# Patient Record
Sex: Female | Born: 1965 | Race: White | Hispanic: No | Marital: Married | Smoking: Former smoker
Health system: Southern US, Community
[De-identification: ages and names within clinical notes are randomized; demographics above are authoritative.]

## PROBLEM LIST (undated history)

## (undated) DIAGNOSIS — F988 Other specified behavioral and emotional disorders with onset usually occurring in childhood and adolescence: Secondary | ICD-10-CM

## (undated) DIAGNOSIS — I10 Essential (primary) hypertension: Secondary | ICD-10-CM

## (undated) DIAGNOSIS — R51 Headache: Secondary | ICD-10-CM

## (undated) DIAGNOSIS — Z8619 Personal history of other infectious and parasitic diseases: Secondary | ICD-10-CM

## (undated) DIAGNOSIS — K829 Disease of gallbladder, unspecified: Secondary | ICD-10-CM

## (undated) DIAGNOSIS — R519 Headache, unspecified: Secondary | ICD-10-CM

## (undated) HISTORY — DX: Headache: R51

## (undated) HISTORY — PX: CHOLECYSTECTOMY: SHX55

## (undated) HISTORY — DX: Essential (primary) hypertension: I10

## (undated) HISTORY — DX: Personal history of other infectious and parasitic diseases: Z86.19

## (undated) HISTORY — PX: ENDOMETRIAL ABLATION: SHX621

## (undated) HISTORY — DX: Disease of gallbladder, unspecified: K82.9

## (undated) HISTORY — DX: Other specified behavioral and emotional disorders with onset usually occurring in childhood and adolescence: F98.8

## (undated) HISTORY — DX: Headache, unspecified: R51.9

---

## 1999-08-09 HISTORY — PX: OTHER SURGICAL HISTORY: SHX169

## 1999-09-21 ENCOUNTER — Encounter: Admission: RE | Admit: 1999-09-21 | Discharge: 1999-09-21 | Payer: Self-pay | Admitting: *Deleted

## 1999-09-21 ENCOUNTER — Encounter: Payer: Self-pay | Admitting: *Deleted

## 1999-10-08 ENCOUNTER — Observation Stay (HOSPITAL_COMMUNITY): Admission: RE | Admit: 1999-10-08 | Discharge: 1999-10-09 | Payer: Self-pay | Admitting: General Surgery

## 2004-08-27 ENCOUNTER — Encounter: Admission: RE | Admit: 2004-08-27 | Discharge: 2004-08-27 | Payer: Self-pay | Admitting: Family Medicine
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
## 2006-02-10 ENCOUNTER — Ambulatory Visit: Payer: Self-pay | Admitting: Family Medicine

## 2007-06-28 ENCOUNTER — Telehealth (INDEPENDENT_AMBULATORY_CARE_PROVIDER_SITE_OTHER): Payer: Self-pay | Admitting: Family Medicine

## 2011-09-12 ENCOUNTER — Ambulatory Visit: Payer: BC Managed Care – PPO | Admitting: Internal Medicine

## 2011-09-12 VITALS — BP 112/70 | HR 76 | Temp 98.3°F | Resp 16 | Ht 65.5 in | Wt 148.2 lb

## 2011-09-12 DIAGNOSIS — F988 Other specified behavioral and emotional disorders with onset usually occurring in childhood and adolescence: Secondary | ICD-10-CM

## 2011-09-12 MED ORDER — AMPHETAMINE-DEXTROAMPHETAMINE 10 MG PO TABS: 10.0000 mg | ORAL_TABLET | Freq: Two times a day (BID) | ORAL | Status: DC

## 2011-09-12 NOTE — Progress Notes (Signed)
46 year old female referred to me by friend because of problems in her life with organization and productivity. She has been this way since early childhood. She excepted poor academic performance rather than ever being treated for attention deficit disorder in middle school and high school. 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 This is provided a very satisfactory life. She has a 4 year old son who is in the first year at [employer] and he was suffering academic failure for the first time in his life during the first semester. He had been a Consulting civil engineer in high school without much effort. He went for an evaluation for  attention deficit disorder and was found to be positive, started on medication, and in had all A's in his neck semester in college. The daughter is a Holiday representative in high school who has a similar profile procrastination but makes good grades because is so easy.   She has no health problems and is on no medication. She plays tennis and competitive leagues and notes that her attention drifts in the middle of sets. She has trouble balancing her checkbook. she procrastinates andcan't do regular duties at home including cooking meals and paying items on time. She can finishtask easily. She cannot read without reread sentences. She loses things constantly. There is no hyperactivity.  Review of systems is otherwise negative.  Physical exam  HEENT normal without thyromegaly or nodules  Heart has a regular rhythm without murmurs rubs gallops or clicks.  Neuro is intact.  Problem #1 adult attention deficit disorder. She is functioning very well and the question about whether to use medicine is a quality of life issue. We discussed all the side effects and potential problems with medication. She would prefer a trial of medication to see if it helps with the problem she finds with organization and efficiency.  Plan Adderall 10 mg for a 4-6 hour window. She  may very the dose to estimate the effectiveness. She will return in 3 Lotter for reevaluation.

## 2011-09-16 NOTE — Progress Notes (Unsigned)
Filled out Prior Auth form for pt's adderall and faxed to insurance with confirmation.

## 2011-10-05 ENCOUNTER — Encounter: Payer: Self-pay | Admitting: Internal Medicine

## 2011-10-05 ENCOUNTER — Ambulatory Visit (INDEPENDENT_AMBULATORY_CARE_PROVIDER_SITE_OTHER): Payer: BC Managed Care – PPO | Admitting: Internal Medicine

## 2011-10-05 VITALS — BP 114/71 | HR 67 | Temp 97.6°F | Resp 16 | Ht 65.25 in | Wt 147.6 lb

## 2011-10-05 DIAGNOSIS — F988 Other specified behavioral and emotional disorders with onset usually occurring in childhood and adolescence: Secondary | ICD-10-CM

## 2011-10-05 MED ORDER — AMPHETAMINE-DEXTROAMPHETAMINE 10 MG PO TABS: 10.0000 mg | ORAL_TABLET | Freq: Two times a day (BID) | ORAL | Status: DC

## 2011-10-05 MED ORDER — AMPHETAMINE-DEXTROAMPHETAMINE 10 MG PO TABS: 10.0000 mg | ORAL_TABLET | Freq: Every day | ORAL | Status: DC

## 2011-10-05 NOTE — Progress Notes (Signed)
  Subjective:    Patient ID: Rachael Scott, female    DOB: 1966-02-09, 46 y.o.   MRN: [id number]  HPIFollowup after initial diagnosis of ADD and first attempted treatment with Adderall 10 mg She has done extremely well. This greatly improves her distractibility, her task completion, the organization, her ability to focus in sports and outside events,And her procrastination. Her husband doesn't know she is on medication and has been wondering about her much work she gets done now. There are no bad side effects  Son ADHD [employer] club swimming predental junior/daughter 17 to converse Review of Systems     Objective:   Physical ExamVital signs stable and neurological intact        Assessment & Plan:  ADD-Adult  Adderall 10 Twice a day #60x3 months  Followup in 3 months for recheck

## 2013-05-21 ENCOUNTER — Ambulatory Visit (INDEPENDENT_AMBULATORY_CARE_PROVIDER_SITE_OTHER): Payer: BC Managed Care – PPO | Admitting: Podiatry

## 2013-05-21 ENCOUNTER — Ambulatory Visit (INDEPENDENT_AMBULATORY_CARE_PROVIDER_SITE_OTHER): Payer: BC Managed Care – PPO

## 2013-05-21 ENCOUNTER — Encounter: Payer: Self-pay | Admitting: Podiatry

## 2013-05-21 VITALS — BP 111/66 | HR 63 | Resp 12 | Ht 65.0 in | Wt 140.0 lb

## 2013-05-21 DIAGNOSIS — M722 Plantar fascial fibromatosis: Secondary | ICD-10-CM

## 2013-05-21 MED ORDER — METHYLPREDNISOLONE (PAK) 4 MG PO TABS: ORAL_TABLET | ORAL | Status: DC

## 2013-05-21 NOTE — Progress Notes (Signed)
N-sharp, burning L-plantar heel left D-1year O-gradual C-AM pain, active in tennis, worse A-walking/activity T-Tylenol, gel inserts, ice  **Has a tennis tournament coming up soon**

## 2013-05-21 NOTE — Progress Notes (Signed)
Marki presents today with a cc of plantar faciitis of the left heel for over a year.  States that the pain is sharp and burning.  Has tried tylenol and gel inserts.  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  Mornings are worse and activity causes more pain.  O: VSS, A&OX3.  I have reviewed her PMH, allergies and medications.  Pulses are strong and palpable b/l.Neuro is intact b/l. DTR and muscle strength is normal and symmetrical.Orthopedic evaluation demonstrates all joints distal to the ankle have a full range of motion without crepitation.  Positive pain on palpation of the left heel at the plantar fascial calcaneal insertion site.  Radiographs demonstrate thickening of the plantar fascia at the calcaneal insertion site left.  Cutaneous evaluation demonstrates normal dermatologic findings.  A:  Plantar fasciitis left foot time one year plus.  P:  Injection of the left heel, plantar fascial strapping, night splint left. Medrol 6 day pack. Follow up with her in one month.

## 2013-05-21 NOTE — Patient Instructions (Addendum)
Plantar Fasciitis Plantar fasciitis is a common condition that causes foot pain. It is soreness (inflammation) of the band of tough fibrous tissue on the bottom of the foot that runs from the heel bone (calcaneus) to the ball of the foot. The cause of this soreness may be from excessive standing, poor fitting shoes, running on hard surfaces, being overweight, having an abnormal walk, or overuse (this is common in runners) of the painful foot or feet. It is also common in aerobic exercise dancers and ballet dancers. SYMPTOMS  Most people with plantar fasciitis complain of:  Severe pain in the morning on the bottom of their foot especially when taking the first steps out of bed. This pain recedes after a few minutes of walking.  Severe pain is experienced also during walking following a long period of inactivity.  Pain is worse when walking barefoot or up stairs DIAGNOSIS   Your caregiver will diagnose this condition by examining and feeling your foot.  Special tests such as X-rays of your foot, are usually not needed. PREVENTION   Consult a sports medicine professional before beginning a new exercise program.  Walking programs offer a good workout. With walking there is a lower chance of overuse injuries common to runners. There is less impact and less jarring of the joints.  Begin all new exercise programs slowly. If problems or pain develop, decrease the amount of time or distance until you are at a comfortable level.  Wear good shoes and replace them regularly.  Stretch your foot and the heel cords at the back of the ankle (Achilles tendon) both before and after exercise.  Run or exercise on even surfaces that are not hard. For example, asphalt is better than pavement.  Do not run barefoot on hard surfaces.  If using a treadmill, vary the incline.  Do not continue to workout if you have foot or joint problems. Seek professional help if they do not improve. HOME CARE INSTRUCTIONS     Avoid activities that cause you pain until you recover.  Use ice or cold packs on the problem or painful areas after working out.  Only take over-the-counter or prescription medicines for pain, discomfort, or fever as directed by your caregiver.  Soft shoe inserts or athletic shoes with air or gel sole cushions may be helpful.  If problems continue or become more severe, consult a sports medicine caregiver or your own health care provider. Cortisone is a potent anti-inflammatory medication that may be injected into the painful area. You can discuss this treatment with your caregiver. MAKE SURE YOU:   Understand these instructions.  Will watch your condition.  Will get help right away if you are not doing well or get worse. Document Released: 04/19/2001 Document Revised: 10/17/2011 Document Reviewed: 06/18/2008 Exit[employer] Patient Information 2014 [employer]. Plantar Fasciitis (Heel Spur Syndrome) with Rehab The plantar fascia is a fibrous, ligament-like, soft-tissue structure that spans the bottom of the foot. Plantar fasciitis is a condition that causes pain in the foot due to inflammation of the tissue. SYMPTOMS  Pain and tenderness on the underneath side of the foot. Pain that worsens with standing or walking. CAUSES  Plantar fasciitis is caused by irritation and injury to the plantar fascia on the underneath side of the foot. Common mechanisms of injury include: Direct trauma to bottom of the foot. Damage to a small nerve that runs under the foot where the main fascia attaches to the heel bone. Stress placed on the plantar fascia due   to bone spurs. RISK INCREASES WITH:  Activities that place stress on the plantar fascia (running, jumping, pivoting, or cutting). Poor strength and flexibility. Improperly fitted shoes. Tight calf muscles. Flat feet. Failure to warm-up properly before activity. Obesity. PREVENTION Warm up and stretch properly before activity. Allow for  adequate recovery between workouts. Maintain physical fitness: Strength, flexibility, and endurance. Cardiovascular fitness. Maintain a health body weight. Avoid stress on the plantar fascia. Wear properly fitted shoes, including arch supports for individuals who have flat feet. PROGNOSIS  If treated properly, then the symptoms of plantar fasciitis usually resolve without surgery. However, occasionally surgery is necessary. RELATED COMPLICATIONS  Recurrent symptoms that may result in a chronic condition. Problems of the lower back that are caused by compensating for the injury, such as limping. Pain or weakness of the foot during push-off following surgery. Chronic inflammation, scarring, and partial or complete fascia tear, occurring more often from repeated injections. TREATMENT  Treatment initially involves the use of ice and medication to help reduce pain and inflammation. The use of strengthening and stretching exercises may help reduce pain with activity, especially stretches of the Achilles tendon. These exercises may be performed at home or with a therapist. Your caregiver may recommend that you use heel cups of arch supports to help reduce stress on the plantar fascia. Occasionally, corticosteroid injections are given to reduce inflammation. If symptoms persist for greater than 6 months despite non-surgical (conservative), then surgery may be recommended.  MEDICATION  If pain medication is necessary, then nonsteroidal anti-inflammatory medications, such as aspirin and ibuprofen, or other minor pain relievers, such as acetaminophen, are often recommended. Do not take pain medication within 7 days before surgery. Prescription pain relievers may be given if deemed necessary by your caregiver. Use only as directed and only as much as you need. Corticosteroid injections may be given by your caregiver. These injections should be reserved for the most serious cases, because they may only be  given a certain number of times. HEAT AND COLD Cold treatment (icing) relieves pain and reduces inflammation. Cold treatment should be applied for 10 to 15 minutes every 2 to 3 hours for inflammation and pain and immediately after any activity that aggravates your symptoms. Use ice packs or massage the area with a piece of ice (ice massage). Heat treatment may be used prior to performing the stretching and strengthening activities prescribed by your caregiver, physical therapist, or athletic trainer. Use a heat pack or soak the injury in warm water. SEEK IMMEDIATE MEDICAL CARE IF: Treatment seems to offer no benefit, or the condition worsens. Any medications produce adverse side effects. EXERCISES RANGE OF MOTION (ROM) AND STRETCHING EXERCISES - Plantar Fasciitis (Heel Spur Syndrome) These exercises may help you when beginning to rehabilitate your injury. Your symptoms may resolve with or without further involvement from your physician, physical therapist or athletic trainer. While completing these exercises, remember:  Restoring tissue flexibility helps normal motion to return to the joints. This allows healthier, less painful movement and activity. An effective stretch should be held for at least 30 seconds. A stretch should never be painful. You should only feel a gentle lengthening or release in the stretched tissue. RANGE OF MOTION - Toe Extension, Flexion Sit with your right / left leg crossed over your opposite knee. Grasp your toes and gently pull them back toward the top of your foot. You should feel a stretch on the bottom of your toes and/or foot. Hold this stretch for __________ seconds. Now, gently   pull your toes toward the bottom of your foot. You should feel a stretch on the top of your toes and or foot. Hold this stretch for __________ seconds. Repeat __________ times. Complete this stretch __________ times per day.  RANGE OF MOTION - Ankle Dorsiflexion, Active Assisted Remove  shoes and sit on a chair that is preferably not on a carpeted surface. Place right / left foot under knee. Extend your opposite leg for support. Keeping your heel down, slide your right / left foot back toward the chair until you feel a stretch at your ankle or calf. If you do not feel a stretch, slide your bottom forward to the edge of the chair, while still keeping your heel down. Hold this stretch for __________ seconds. Repeat __________ times. Complete this stretch __________ times per day.  STRETCH  Gastroc, Standing Place hands on wall. Extend right / left leg, keeping the front knee somewhat bent. Slightly point your toes inward on your back foot. Keeping your right / left heel on the floor and your knee straight, shift your weight toward the wall, not allowing your back to arch. You should feel a gentle stretch in the right / left calf. Hold this position for __________ seconds. Repeat __________ times. Complete this stretch __________ times per day. STRETCH  Soleus, Standing Place hands on wall. Extend right / left leg, keeping the other knee somewhat bent. Slightly point your toes inward on your back foot. Keep your right / left heel on the floor, bend your back knee, and slightly shift your weight over the back leg so that you feel a gentle stretch deep in your back calf. Hold this position for __________ seconds. Repeat __________ times. Complete this stretch __________ times per day. STRETCH  Gastrocsoleus, Standing  Note: This exercise can place a lot of stress on your foot and ankle. Please complete this exercise only if specifically instructed by your caregiver.  Place the ball of your right / left foot on a step, keeping your other foot firmly on the same step. Hold on to the wall or a rail for balance. Slowly lift your other foot, allowing your body weight to press your heel down over the edge of the step. You should feel a stretch in your right / left calf. Hold this  position for __________ seconds. Repeat this exercise with a slight bend in your right / left knee. Repeat __________ times. Complete this stretch __________ times per day.  STRENGTHENING EXERCISES - Plantar Fasciitis (Heel Spur Syndrome)  These exercises may help you when beginning to rehabilitate your injury. They may resolve your symptoms with or without further involvement from your physician, physical therapist or athletic trainer. While completing these exercises, remember:  Muscles can gain both the endurance and the strength needed for everyday activities through controlled exercises. Complete these exercises as instructed by your physician, physical therapist or athletic trainer. Progress the resistance and repetitions only as guided. STRENGTH - Towel Curls Sit in a chair positioned on a non-carpeted surface. Place your foot on a towel, keeping your heel on the floor. Pull the towel toward your heel by only curling your toes. Keep your heel on the floor. If instructed by your physician, physical therapist or athletic trainer, add ____________________ at the end of the towel. Repeat __________ times. Complete this exercise __________ times per day. STRENGTH - Ankle Inversion Secure one end of a rubber exercise band/tubing to a fixed object (table, pole). Loop the other end around your foot   just before your toes. Place your fists between your knees. This will focus your strengthening at your ankle. Slowly, pull your big toe up and in, making sure the band/tubing is positioned to resist the entire motion. Hold this position for __________ seconds. Have your muscles resist the band/tubing as it slowly pulls your foot back to the starting position. Repeat __________ times. Complete this exercises __________ times per day.  Document Released: 07/25/2005 Document Revised: 10/17/2011 Document Reviewed: 11/06/2008 Exit[employer] Patient Information 2014 [employer].  

## 2013-06-25 ENCOUNTER — Ambulatory Visit: Payer: BC Managed Care – PPO | Admitting: Podiatry

## 2014-04-30 DIAGNOSIS — G43109 Migraine with aura, not intractable, without status migrainosus: Secondary | ICD-10-CM | POA: Insufficient documentation

## 2015-04-24 ENCOUNTER — Other Ambulatory Visit: Payer: Self-pay | Admitting: *Deleted

## 2015-04-24 DIAGNOSIS — I8392 Asymptomatic varicose veins of left lower extremity: Secondary | ICD-10-CM

## 2015-05-07 ENCOUNTER — Encounter: Payer: Self-pay | Admitting: Vascular Surgery

## 2015-05-11 ENCOUNTER — Encounter: Payer: Self-pay | Admitting: Vascular Surgery

## 2015-05-11 ENCOUNTER — Inpatient Hospital Stay (HOSPITAL_COMMUNITY): Admission: RE | Admit: 2015-05-11 | Payer: Self-pay | Source: Ambulatory Visit

## 2015-08-14 ENCOUNTER — Emergency Department (HOSPITAL_COMMUNITY): Payer: BLUE CROSS/BLUE SHIELD

## 2015-08-14 ENCOUNTER — Encounter (HOSPITAL_COMMUNITY): Payer: Self-pay | Admitting: Family Medicine

## 2015-08-14 ENCOUNTER — Emergency Department (HOSPITAL_COMMUNITY)
Admission: EM | Admit: 2015-08-14 | Discharge: 2015-08-14 | Disposition: A | Discharge status: Home / Self Care | Payer: BLUE CROSS/BLUE SHIELD | Attending: Emergency Medicine | Admitting: Emergency Medicine

## 2015-08-14 DIAGNOSIS — R110 Nausea: Secondary | ICD-10-CM | POA: Diagnosis not present

## 2015-08-14 DIAGNOSIS — R079 Chest pain, unspecified: Secondary | ICD-10-CM | POA: Diagnosis present

## 2015-08-14 DIAGNOSIS — R0602 Shortness of breath: Secondary | ICD-10-CM | POA: Diagnosis not present

## 2015-08-14 DIAGNOSIS — Z87891 Personal history of nicotine dependence: Secondary | ICD-10-CM | POA: Insufficient documentation

## 2015-08-14 LAB — CBC
HCT: 40.7 % (ref 36.0–46.0)
Hemoglobin: 13.8 g/dL (ref 12.0–15.0)
MCH: 31 pg (ref 26.0–34.0)
MCHC: 33.9 g/dL (ref 30.0–36.0)
MCV: 91.5 fL (ref 78.0–100.0)
Platelets: 220 10*3/uL (ref 150–400)
RBC: 4.45 MIL/uL (ref 3.87–5.11)
RDW: 12.5 % (ref 11.5–15.5)
WBC: 5.8 10*3/uL (ref 4.0–10.5)

## 2015-08-14 LAB — BASIC METABOLIC PANEL
ANION GAP: 10 (ref 5–15)
BUN: 11 mg/dL (ref 6–20)
CALCIUM: 9.3 mg/dL (ref 8.9–10.3)
CO2: 24 mmol/L (ref 22–32)
Chloride: 107 mmol/L (ref 101–111)
Creatinine, Ser: 0.78 mg/dL (ref 0.44–1.00)
GFR calc Af Amer: >60 mL/min (ref >60)
GLUCOSE: 100 mg/dL — AB (ref 65–99)
Potassium: 3.6 mmol/L (ref 3.5–5.1)
Sodium: 141 mmol/L (ref 135–145)

## 2015-08-14 LAB — I-STAT TROPONIN, ED
TROPONIN I, POC: 0 ng/mL (ref 0.00–0.08)
Troponin i, poc: 0 ng/mL (ref 0.00–0.08)
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
## 2015-08-14 NOTE — Discharge Instructions (Signed)
Try Zantac 150 mg twice a day.  Nonspecific Chest Pain It is often hard to find the cause of chest pain. There is always a chance that your pain could be related to something serious, such as a heart attack or a blood clot in your lungs. Chest pain can also be caused by conditions that are not life-threatening. If you have chest pain, it is very important to follow up with your doctor.  HOME CARE  If you were prescribed an antibiotic medicine, finish it all even if you start to feel better.  Avoid any activities that cause chest pain.  Do not use any tobacco products, including cigarettes, chewing tobacco, or electronic cigarettes. If you need help quitting, ask your doctor.  Do not drink alcohol.  Take medicines only as told by your doctor.  Keep all follow-up visits as told by your doctor. This is important. This includes any further testing if your chest pain does not go away.  Your doctor may tell you to keep your head raised (elevated) while you sleep.  Make lifestyle changes as told by your doctor. These may include:  Getting regular exercise. Ask your doctor to suggest some activities that are safe for you.  Eating a heart-healthy diet. Your doctor or a diet specialist (dietitian) can help you to learn healthy eating options.  Maintaining a healthy weight.  Managing diabetes, if necessary.  Reducing stress. GET HELP IF:  Your chest pain does not go away, even after treatment.  You have a rash with blisters on your chest.  You have a fever. GET HELP RIGHT AWAY IF:  Your chest pain is worse.  You have an increasing cough, or you cough up blood.  You have severe belly (abdominal) pain.  You feel extremely weak.  You pass out (faint).  You have chills.  You have sudden, unexplained chest discomfort.  You have sudden, unexplained discomfort in your arms, back, neck, or jaw.  You have shortness of breath at any time.  You suddenly start to sweat, or your  skin gets clammy.  You feel nauseous.  You vomit.  You suddenly feel light-headed or dizzy.  Your heart begins to beat quickly, or it feels like it is skipping beats. These symptoms may be an emergency. Do not wait to see if the symptoms will go away. Get medical help right away. Call your local emergency services (911 in the [place] Do not drive yourself to the hospital.   This information is not intended to replace advice given to you by your health care provider. Make sure you discuss any questions you have with your health care provider.   Document Released: 01/11/2008 Document Revised: 08/15/2014 Document Reviewed: 02/28/2014 Elsevier Interactive Patient Education [employer].

## 2015-08-14 NOTE — ED Notes (Signed)
EDP at bedside  

## 2015-08-14 NOTE — ED Provider Notes (Signed)
CSN: [id number]     Arrival date & time 08/14/15  1632 History   First MD Initiated Contact with Patient 08/14/15 1907     Chief Complaint  Patient presents with  . Chest Pain  . Hypertension     (Consider location/radiation/quality/duration/timing/severity/associated sxs/prior Treatment) Patient is a 50 y.o. female presenting with chest pain and hypertension.  Chest Pain Pain location:  L lateral chest Pain quality: sharp and shooting   Pain radiates to:  Does not radiate Pain radiates to the back: no   Pain severity:  Moderate Onset quality:  Sudden Duration:  45 minutes Timing:  Rare Progression:  Resolved Chronicity:  Recurrent Relieved by:  Nothing Worsened by:  Nothing tried Ineffective treatments:  None tried Associated symptoms: nausea and shortness of breath   Associated symptoms: no dizziness, no fever, no headache, no palpitations and not vomiting   Hypertension Associated symptoms include chest pain and shortness of breath. Pertinent negatives include no headaches.   Chest pain started today while at physical therapy.  "felt like she pulled a muscle in her chest"  N/diaphoresis.  Worse with deep inspiration.  Lasted about 31min.  Denies smoking, Fam hx of heart disease.  No hx of HTN, HLD, DM.   History reviewed. No pertinent past medical history. Past Surgical History  Procedure Laterality Date  . Cholecystectomy     Family History  Problem Relation Age of Onset  . Hypertension Mother   . Hypertension Father   . Hypothyroidism Sister    Social History  Substance Use Topics  . Smoking status: Former Smoker -- 0.50 packs/day for 15 years    Types: Cigarettes    Quit date: 08/01/2003  . Smokeless tobacco: None  . Alcohol Use: Yes     Comment:  rare - once a month glass of wine   OB History    No data available     Review of Systems  Constitutional: Negative for fever and chills.  HENT: Negative for congestion and rhinorrhea.   Eyes: Negative for  redness and visual disturbance.  Respiratory: Positive for shortness of breath. Negative for wheezing.   Cardiovascular: Positive for chest pain. Negative for palpitations.  Gastrointestinal: Positive for nausea. Negative for vomiting.  Genitourinary: Negative for dysuria and urgency.  Musculoskeletal: Negative for myalgias and arthralgias.  Skin: Negative for pallor and wound.  Neurological: Negative for dizziness and headaches.      Allergies  Review of patient's allergies indicates no known allergies.  Home Medications   Prior to Admission medications   Medication Sig Start Date End Date Taking? Authorizing Provider  acetaminophen (TYLENOL) 325 MG tablet Take 650 mg by mouth every 6 (six) hours as needed for pain.    Historical Provider, MD  methylPREDNIsolone (MEDROL DOSPACK) 4 MG tablet follow package directions 05/21/13   Max T Hyatt, DPM   BP 136/80 mmHg  Pulse 66  Temp(Src) 98.1 F (36.7 C) (Oral)  Resp 17  SpO2 100%  LMP 08/05/2015 Physical Exam  Constitutional: She is oriented to person, place, and time. She appears well-developed and well-nourished. No distress.  HENT:  Head: Normocephalic and atraumatic.  Eyes: EOM are normal. Pupils are equal, round, and reactive to light.  Neck: Normal range of motion. Neck supple.  Cardiovascular: Normal rate and regular rhythm.  Exam reveals no gallop and no friction rub.   No murmur heard. Pulmonary/Chest: Effort normal. She has no wheezes. She has no rales.  Abdominal: Soft. She exhibits no distension. There is no  tenderness.  Musculoskeletal: She exhibits no edema or tenderness.  Neurological: She is alert and oriented to person, place, and time.  Skin: Skin is warm and dry. She is not diaphoretic.  Psychiatric: She has a normal mood and affect. Her behavior is normal.  Nursing note and vitals reviewed.   ED Course  Procedures (including critical care time) Labs Review Labs Reviewed  BASIC METABOLIC PANEL -  Abnormal; Notable for the following:    Glucose, Bld 100 (*)    All other components within normal limits  CBC  I-STAT TROPOININ, ED  Randolm Idol, ED    Imaging Review Dg Chest 2 View  08/14/2015  CLINICAL DATA:  Chest pain, headache and shortness of breath today. Initial encounter. EXAM: CHEST  2 VIEW COMPARISON:  None. FINDINGS: The lungs are clear. Heart size is normal. There is no pneumothorax or pleural effusion. No focal bony abnormality. The patient is status post cholecystectomy appear IMPRESSION: Negative chest. Electronically Signed   By: Inge Rise M.D.   On: 08/14/2015 17:41   I have personally reviewed and evaluated these images and lab results as part of my medical decision-making.  ED ECG REPORT   Date: 08/17/2015  Rate: 72  Rhythm: normal sinus rhythm  QRS Axis: normal  Intervals: normal  ST/T Wave abnormalities: normal  Conduction Disutrbances:none  Narrative Interpretation:   Old EKG Reviewed: unchanged  I have personally reviewed the EKG tracing and agree with the computerized printout as noted.   MDM   Final diagnoses:  Chest pain, unspecified chest pain type    50 yo F with Chest pain.  Lasted for about 45 min, resolved with rest. EKG unremarkable chest x-ray normal. Delta troponin negative. PERC negative. Patient is pain-free in the ED. We'll have her follow-up with her family physician.   I have discussed the diagnosis/risks/treatment options with the patient and family and believe the pt to be eligible for discharge home to follow-up with PCP. We also discussed returning to the ED immediately if new or worsening sx occur. We discussed the sx which are most concerning (e.g., sudden worsening pain, fever, inability to tolerate by mouth) that necessitate immediate return. Medications administered to the patient during their visit and any new prescriptions provided to the patient are listed below.  Medications given during this visit Medications -  No data to display  Discharge Medication List as of 08/14/2015  9:20 PM      The patient appears reasonably screen and/or stabilized for discharge and I doubt any other medical condition or other [facility] requiring further screening, evaluation, or treatment in the ED at this time prior to discharge.      Deno Etienne, DO 08/17/15 1404

## 2017-09-05 ENCOUNTER — Other Ambulatory Visit (HOSPITAL_COMMUNITY)
Admission: RE | Admit: 2017-09-05 | Discharge: 2017-09-05 | Disposition: A | Discharge status: Home / Self Care | Payer: BLUE CROSS/BLUE SHIELD | Source: Ambulatory Visit | Attending: Nurse Practitioner | Admitting: Nurse Practitioner

## 2017-09-05 ENCOUNTER — Ambulatory Visit: Payer: BLUE CROSS/BLUE SHIELD | Admitting: Nurse Practitioner

## 2017-09-05 ENCOUNTER — Encounter: Payer: Self-pay | Admitting: Nurse Practitioner

## 2017-09-05 VITALS — BP 138/78 | HR 74 | Temp 97.4°F | Ht 65.0 in | Wt 152.0 lb

## 2017-09-05 DIAGNOSIS — Z124 Encounter for screening for malignant neoplasm of cervix: Secondary | ICD-10-CM

## 2017-09-05 DIAGNOSIS — F988 Other specified behavioral and emotional disorders with onset usually occurring in childhood and adolescence: Secondary | ICD-10-CM | POA: Insufficient documentation

## 2017-09-05 DIAGNOSIS — Z0000 Encounter for general adult medical examination without abnormal findings: Secondary | ICD-10-CM | POA: Insufficient documentation

## 2017-09-05 DIAGNOSIS — G43909 Migraine, unspecified, not intractable, without status migrainosus: Secondary | ICD-10-CM | POA: Insufficient documentation

## 2017-09-05 DIAGNOSIS — Z1231 Encounter for screening mammogram for malignant neoplasm of breast: Secondary | ICD-10-CM

## 2017-09-05 DIAGNOSIS — Z87891 Personal history of nicotine dependence: Secondary | ICD-10-CM | POA: Insufficient documentation

## 2017-09-05 DIAGNOSIS — Z1211 Encounter for screening for malignant neoplasm of colon: Secondary | ICD-10-CM | POA: Diagnosis not present

## 2017-09-05 DIAGNOSIS — Z136 Encounter for screening for cardiovascular disorders: Secondary | ICD-10-CM

## 2017-09-05 DIAGNOSIS — Z13220 Encounter for screening for lipoid disorders: Secondary | ICD-10-CM | POA: Diagnosis not present

## 2017-09-05 NOTE — Patient Instructions (Addendum)
Please sign medical release to get records from Dr. Sonia Baller and/or Dr. Raliegh Scarlet, [place].  Return to lab fasting at least 6-8hrs prior to blood draw.   you will be contacted to schedule mammogram.  Contact your insurance about cologuard cost.  Health Maintenance, Female Adopting a healthy lifestyle and getting preventive care can go a long way to promote health and wellness. Talk with your health care provider about what schedule of regular examinations is right for you. This is a good chance for you to check in with your provider about disease prevention and staying healthy. In between checkups, there are plenty of things you can do on your own. Experts have done a lot of research about which lifestyle changes and preventive measures are most likely to keep you healthy. Ask your health care provider for more information. Weight and diet Eat a healthy diet  Be sure to include plenty of vegetables, fruits, low-fat dairy products, and lean protein.  Do not eat a lot of foods high in solid fats, added sugars, or salt.  Get regular exercise. This is one of the most important things you can do for your health. ? Most adults should exercise for at least 150 minutes each week. The exercise should increase your heart rate and make you sweat (moderate-intensity exercise). ? Most adults should also do strengthening exercises at least twice a week. This is in addition to the moderate-intensity exercise.  Maintain a healthy weight  Body mass index (BMI) is a measurement that can be used to identify possible weight problems. It estimates body fat based on height and weight. Your health care provider can help determine your BMI and help you achieve or maintain a healthy weight.  For females 37 years of age and older: ? A BMI below 18.5 is considered underweight. ? A BMI of 18.5 to 24.9 is normal. ? A BMI of 25 to 29.9 is considered overweight. ? A BMI of 30 and above is considered obese.  Watch  levels of cholesterol and blood lipids  You should start having your blood tested for lipids and cholesterol at 52 years of age, then have this test every 5 years.  You may need to have your cholesterol levels checked more often if: ? Your lipid or cholesterol levels are high. ? You are older than 52 years of age. ? You are at high risk for heart disease.  Cancer screening Lung Cancer  Lung cancer screening is recommended for adults 5-73 years old who are at high risk for lung cancer because of a history of smoking.  A yearly low-dose CT scan of the lungs is recommended for people who: ? Currently smoke. ? Have quit within the past 15 years. ? Have at least a 30-pack-year history of smoking. A pack year is smoking an average of one pack of cigarettes a day for 1 year.  Yearly screening should continue until it has been 15 years since you quit.  Yearly screening should stop if you develop a health problem that would prevent you from having lung cancer treatment.  Breast Cancer  Practice breast self-awareness. This means understanding how your breasts normally appear and feel.  It also means doing regular breast self-exams. Let your health care provider know about any changes, no matter how small.  If you are in your 20s or 30s, you should have a clinical breast exam (CBE) by a health care provider every 1-3 years as part of a regular health exam.  If you are  73 or older, have a CBE every year. Also consider having a breast X-ray (mammogram) every year.  If you have a family history of breast cancer, talk to your health care provider about genetic screening.  If you are at high risk for breast cancer, talk to your health care provider about having an MRI and a mammogram every year.  Breast cancer gene (BRCA) assessment is recommended for women who have family members with BRCA-related cancers. BRCA-related cancers include: ? Breast. ? Ovarian. ? Tubal. ? Peritoneal  cancers.  Results of the assessment will determine the need for genetic counseling and BRCA1 and BRCA2 testing.  Cervical Cancer Your health care provider may recommend that you be screened regularly for cancer of the pelvic organs (ovaries, uterus, and vagina). This screening involves a pelvic examination, including checking for microscopic changes to the surface of your cervix (Pap test). You may be encouraged to have this screening done every 3 years, beginning at age 14.  For women ages 34-65, health care providers may recommend pelvic exams and Pap testing every 3 years, or they may recommend the Pap and pelvic exam, combined with testing for human papilloma virus (HPV), every 5 years. Some types of HPV increase your risk of cervical cancer. Testing for HPV may also be done on women of any age with unclear Pap test results.  Other health care providers may not recommend any screening for nonpregnant women who are considered low risk for pelvic cancer and who do not have symptoms. Ask your health care provider if a screening pelvic exam is right for you.  If you have had past treatment for cervical cancer or a condition that could lead to cancer, you need Pap tests and screening for cancer for at least 20 years after your treatment. If Pap tests have been discontinued, your risk factors (such as having a new sexual partner) need to be reassessed to determine if screening should resume. Some women have medical problems that increase the chance of getting cervical cancer. In these cases, your health care provider may recommend more frequent screening and Pap tests.  Colorectal Cancer  This type of cancer can be detected and often prevented.  Routine colorectal cancer screening usually begins at 52 years of age and continues through 52 years of age.  Your health care provider may recommend screening at an earlier age if you have risk factors for colon cancer.  Your health care provider may also  recommend using home test kits to check for hidden blood in the stool.  A small camera at the end of a tube can be used to examine your colon directly (sigmoidoscopy or colonoscopy). This is done to check for the earliest forms of colorectal cancer.  Routine screening usually begins at age 8.  Direct examination of the colon should be repeated every 5-10 years through 52 years of age. However, you may need to be screened more often if early forms of precancerous polyps or small growths are found.  Skin Cancer  Check your skin from head to toe regularly.  Tell your health care provider about any new moles or changes in moles, especially if there is a change in a mole's shape or color.  Also tell your health care provider if you have a mole that is larger than the size of a pencil eraser.  Always use sunscreen. Apply sunscreen liberally and repeatedly throughout the day.  Protect yourself by wearing long sleeves, pants, a wide-brimmed hat, and sunglasses whenever you  are outside.  Heart disease, diabetes, and high blood pressure  High blood pressure causes heart disease and increases the risk of stroke. High blood pressure is more likely to develop in: ? People who have blood pressure in the high end of the normal range (130-139/85-89 mm Hg). ? People who are overweight or obese. ? People who are African American.  If you are 19-20 years of age, have your blood pressure checked every 3-5 years. If you are 49 years of age or older, have your blood pressure checked every year. You should have your blood pressure measured twice-once when you are at a hospital or clinic, and once when you are not at a hospital or clinic. Record the average of the two measurements. To check your blood pressure when you are not at a hospital or clinic, you can use: ? An automated blood pressure machine at a pharmacy. ? A home blood pressure monitor.  If you are between 73 years and 61 years old, ask your  health care provider if you should take aspirin to prevent strokes.  Have regular diabetes screenings. This involves taking a blood sample to check your fasting blood sugar level. ? If you are at a normal weight and have a low risk for diabetes, have this test once every three years after 52 years of age. ? If you are overweight and have a high risk for diabetes, consider being tested at a younger age or more often. Preventing infection Hepatitis B  If you have a higher risk for hepatitis B, you should be screened for this virus. You are considered at high risk for hepatitis B if: ? You were born in a country where hepatitis B is common. Ask your health care provider which countries are considered high risk. ? Your parents were born in a high-risk country, and you have not been immunized against hepatitis B (hepatitis B vaccine). ? You have HIV or AIDS. ? You use needles to inject street drugs. ? You live with someone who has hepatitis B. ? You have had sex with someone who has hepatitis B. ? You get hemodialysis treatment. ? You take certain medicines for conditions, including cancer, organ transplantation, and autoimmune conditions.  Hepatitis C  Blood testing is recommended for: ? Everyone born from [birth year] through 1965. ? Anyone with known risk factors for hepatitis C.  Sexually transmitted infections (STIs)  You should be screened for sexually transmitted infections (STIs) including gonorrhea and chlamydia if: ? You are sexually active and are younger than 52 years of age. ? You are older than 52 years of age and your health care provider tells you that you are at risk for this type of infection. ? Your sexual activity has changed since you were last screened and you are at an increased risk for chlamydia or gonorrhea. Ask your health care provider if you are at risk.  If you do not have HIV, but are at risk, it may be recommended that you take a prescription medicine daily to  prevent HIV infection. This is called pre-exposure prophylaxis (PrEP). You are considered at risk if: ? You are sexually active and do not regularly use condoms or know the HIV status of your partner(s). ? You take drugs by injection. ? You are sexually active with a partner who has HIV.  Talk with your health care provider about whether you are at high risk of being infected with HIV. If you choose to begin PrEP, you should first be  tested for HIV. You should then be tested every 3 months for as long as you are taking PrEP. Pregnancy  If you are premenopausal and you may become pregnant, ask your health care provider about preconception counseling.  If you may become pregnant, take 400 to 800 micrograms (mcg) of folic acid every day.  If you want to prevent pregnancy, talk to your health care provider about birth control (contraception). Osteoporosis and menopause  Osteoporosis is a disease in which the bones lose minerals and strength with aging. This can result in serious bone fractures. Your risk for osteoporosis can be identified using a bone density scan.  If you are 32 years of age or older, or if you are at risk for osteoporosis and fractures, ask your health care provider if you should be screened.  Ask your health care provider whether you should take a calcium or vitamin D supplement to lower your risk for osteoporosis.  Menopause may have certain physical symptoms and risks.  Hormone replacement therapy may reduce some of these symptoms and risks. Talk to your health care provider about whether hormone replacement therapy is right for you. Follow these instructions at home:  Schedule regular health, dental, and eye exams.  Stay current with your immunizations.  Do not use any tobacco products including cigarettes, chewing tobacco, or electronic cigarettes.  If you are pregnant, do not drink alcohol.  If you are breastfeeding, limit how much and how often you drink  alcohol.  Limit alcohol intake to no more than 1 drink per day for nonpregnant women. One drink equals 12 ounces of beer, 5 ounces of wine, or 1 ounces of hard liquor.  Do not use street drugs.  Do not share needles.  Ask your health care provider for help if you need support or information about quitting drugs.  Tell your health care provider if you often feel depressed.  Tell your health care provider if you have ever been abused or do not feel safe at home. This information is not intended to replace advice given to you by your health care provider. Make sure you discuss any questions you have with your health care provider. Document Released: 02/07/2011 Document Revised: 12/31/2015 Document Reviewed: 04/28/2015 Elsevier Interactive Patient Education  [employer].

## 2017-09-05 NOTE — Progress Notes (Signed)
Subjective:    Patient ID: Rachael Scott, female    DOB: [birth year], 52 y.o.   MRN: [id number]  Patient presents today for complete physical  HPI   no previous pcp in last 62yrs.  Migraine: Chronic, waxing and waning. Previous managed by Dr. Bobby Rumpf with headache clinic(now retired) Increased frequency in last 95yrs Trigger related to menstrual cycle. Location: temporal lobe. Symptoms: nausea, vertigo, no aura, light sentivisity. Had CT head done several year ago (normal per patient). Managed with promethazine, maxalt, and flurbiprofen.  Diagnosed with ADD at age 59. Diagnosed by Dr. Sonia Baller (private practice, now retired) Adderall recently prescribed by Dr. Mellody Dance with [facility] onsite clinic. Last seen 28yrs ago. Use of Adderall prn.  Immunizations: (TDAP, Hep C screen, Pneumovax, Influenza, zoster)  Health Maintenance  Topic Date Due  . Tetanus Vaccine  03/16/1985  . Pap Smear  03/17/1987  . Mammogram  03/16/2016  . Colon Cancer Screening  03/16/2016  . HIV Screening  09/05/2018*  . Flu Shot  Completed  *Topic was postponed. The date shown is not the original due date.   Diet:heart healthy.  Weight:  Wt Readings from Last 3 Encounters:  09/05/17 152 lb (68.9 kg)  05/21/13 140 lb (63.5 kg)  10/05/11 147 lb 9.6 oz (67 kg)   Exercise:tennis 3-4times a week.  Fall Risk: Fall Risk  09/05/2017  Falls in the past year? No   Home Safety:hoemw with husband.  Depression/Suicide: Depression screen [facility] 2/9 09/05/2017  Decreased Interest 0  Down, Depressed, Hopeless 0  PHQ - 2 Score 0   No flowsheet data found. Vision:up to date.  Dental:up to date.  Medications and allergies reviewed with patient and updated if appropriate.  Patient Active Problem List   Diagnosis Date Noted  . Hx of migraine headaches 09/06/2017  . Plantar fascial fibromatosis 05/21/2013  . 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 acetaminophen (TYLENOL) 325 MG tablet Take 650 mg by mouth every 6 (six) hours as needed for pain.    Marland Kitchen amphetamine-dextroamphetamine (ADDERALL) 10 MG tablet Take by mouth.    . flurbiprofen (ANSAID) 100 MG tablet TK 1 T PO BID PRF HEADACHE. NO MORE THAN 4 DOSES PER WEEK  5  . promethazine (PHENERGAN) 25 MG tablet Take 25 mg by mouth every 6 (six) hours as needed for nausea or vomiting.    . rizatriptan (MAXALT) 10 MG tablet Take 10 mg by mouth as needed for migraine. May repeat in 2 hours if needed     No current facility-administered medications on file prior to visit.     Past Medical History:  Diagnosis Date  . ADD (attention deficit disorder)   . Gallbladder disease   . Headache    3 a mo  . History of chickenpox     Past Surgical History:  Procedure Laterality Date  . CESAREAN SECTION  1992  . CHOLECYSTECTOMY    . ENDOMETRIAL ABLATION    . tummy  2001   tummy tuck    Social History   Socioeconomic History  . Marital status: Single    Spouse name: None  . Number of children: None  . Years of education: None  . Highest education level: None  Social Needs  . Financial resource strain: None  . Food insecurity - worry: None  . Food insecurity - inability: None  . Transportation needs -  medical: None  . Transportation needs - non-medical: None  Occupational History  . None  Tobacco Use  . Smoking status: Former Smoker    Packs/day: 0.50    Years: 15.00    Pack years: 7.50    Types: Cigarettes    Last attempt to quit: 08/01/2003    Years since quitting: 14.1  . Smokeless tobacco: Never Used  Substance and Sexual Activity  . Alcohol use: Yes    Comment:  rare - once a month glass of wine  . Drug use: No  . Sexual activity: None  Other Topics Concern  . None  Social History Narrative  . None    Family History  Problem Relation Age of Onset  . Hypothyroidism Sister   .  Hypertension Mother   . Hypertension Father        Review of Systems  Constitutional: Negative for fever, malaise/fatigue and weight loss.  HENT: Negative for congestion and sore throat.   Eyes:       Negative for visual changes  Respiratory: Negative for cough and shortness of breath.   Cardiovascular: Negative for chest pain, palpitations and leg swelling.  Gastrointestinal: Negative for blood in stool, constipation, diarrhea and heartburn.  Genitourinary: Negative for dysuria, frequency and urgency.  Musculoskeletal: Negative for falls, joint pain and myalgias.  Skin: Negative for rash.  Neurological: Positive for headaches. Negative for dizziness and sensory change.  Endo/Heme/Allergies: Does not bruise/bleed easily.  Psychiatric/Behavioral: Negative for depression, substance abuse and suicidal ideas. The patient is not nervous/anxious and does not have insomnia.     Objective:   Vitals:   09/05/17 1011  BP: 138/78  Pulse: 74  Temp: (!) 97.4 F (36.3 C)  SpO2: 97%    Body mass index is 25.29 kg/m.   Physical Examination:  Physical Exam  Constitutional: She is oriented to person, place, and time and well-developed, well-nourished, and in no distress. No distress.  HENT:  Right Ear: External ear normal.  Left Ear: External ear normal.  Nose: Nose normal.  Mouth/Throat: No oropharyngeal exudate.  Eyes: Conjunctivae and EOM are normal. Pupils are equal, round, and reactive to light. No scleral icterus.  Neck: Normal range of motion. Neck supple. No thyromegaly present.  Cardiovascular: Normal rate, regular rhythm, normal heart sounds and intact distal pulses.  Pulmonary/Chest: Effort normal and breath sounds normal. Right breast exhibits no mass, no nipple discharge and no skin change. Left breast exhibits no mass, no nipple discharge and no skin change. Breasts are symmetrical.  Abdominal: Soft. Bowel sounds are normal. She exhibits no distension. There is no  tenderness.  Genitourinary: Rectum normal, vagina normal, cervix normal, right adnexa normal, left adnexa normal and vulva normal. Rectal exam shows no external hemorrhoid. Cervix exhibits no motion tenderness. No vaginal discharge found.  Musculoskeletal: Normal range of motion. She exhibits no edema or tenderness.  Lymphadenopathy:    She has no cervical adenopathy.  Neurological: She is alert and oriented to person, place, and time. Gait normal.  Skin: Skin is warm and dry.  Psychiatric: Affect and judgment normal.  Vitals reviewed.   ASSESSMENT and PLAN:  Sameeha was seen today for establish care.  Diagnoses and all orders for this visit:  Encounter for preventative adult health care examination -     TSH; Future -     Comprehensive metabolic panel; Future -     CBC; Future -     Lipid panel; Future -     Cytology - PAP -  MS DIGITAL SCREENING BILATERAL; Future  Encounter for lipid screening for cardiovascular disease -     Lipid panel; Future  Breast cancer screening by mammogram -     MS DIGITAL SCREENING BILATERAL; Future  Screen for colon cancer  Encounter for Papanicolaou smear for cervical cancer screening -     Cytology - PAP   No problem-specific Assessment & Plan notes found for this encounter.     Follow up: Return if symptoms worsen or fail to improve.  Wilfred Lacy, NP

## 2017-09-06 ENCOUNTER — Encounter: Payer: Self-pay | Admitting: Nurse Practitioner

## 2017-09-06 ENCOUNTER — Telehealth: Payer: Self-pay | Admitting: Nurse Practitioner

## 2017-09-06 DIAGNOSIS — Z8669 Personal history of other diseases of the nervous system and sense organs: Secondary | ICD-10-CM | POA: Insufficient documentation

## 2017-09-06 NOTE — Telephone Encounter (Signed)
Cologuard order for the pt,  Waiting for the result.   Order [id number]

## 2017-09-07 ENCOUNTER — Other Ambulatory Visit (INDEPENDENT_AMBULATORY_CARE_PROVIDER_SITE_OTHER): Payer: BLUE CROSS/BLUE SHIELD

## 2017-09-07 DIAGNOSIS — Z0000 Encounter for general adult medical examination without abnormal findings: Secondary | ICD-10-CM

## 2017-09-07 DIAGNOSIS — Z13220 Encounter for screening for lipoid disorders: Secondary | ICD-10-CM

## 2017-09-07 DIAGNOSIS — Z136 Encounter for screening for cardiovascular disorders: Secondary | ICD-10-CM

## 2017-09-07 LAB — COMPREHENSIVE METABOLIC PANEL
ALBUMIN: 4.2 g/dL (ref 3.5–5.2)
ALT: 8 U/L (ref 0–35)
AST: 15 U/L (ref 0–37)
Alkaline Phosphatase: 43 U/L (ref 39–117)
BUN: 17 mg/dL (ref 6–23)
CALCIUM: 9.1 mg/dL (ref 8.4–10.5)
CHLORIDE: 104 meq/L (ref 96–112)
CO2: 26 meq/L (ref 19–32)
CREATININE: 0.79 mg/dL (ref 0.40–1.20)
GFR: 81.39 mL/min (ref >60.00)
Glucose, Bld: 92 mg/dL (ref 70–99)
Potassium: 4.5 mEq/L (ref 3.5–5.1)
Sodium: 136 mEq/L (ref 135–145)
Total Bilirubin: 0.8 mg/dL (ref 0.2–1.2)
Total Protein: 7 g/dL (ref 6.0–8.3)

## 2017-09-07 LAB — CBC
HCT: 41.6 % (ref 36.0–46.0)
Hemoglobin: 14.2 g/dL (ref 12.0–15.0)
MCHC: 34.1 g/dL (ref 30.0–36.0)
MCV: 91.1 fl (ref 78.0–100.0)
PLATELETS: 201 10*3/uL (ref 150.0–400.0)
RBC: 4.57 Mil/uL (ref 3.87–5.11)
RDW: 12.9 % (ref 11.5–15.5)
WBC: 4.6 10*3/uL (ref 4.0–10.5)

## 2017-09-07 LAB — LIPID PANEL
CHOL/HDL RATIO: 3
Cholesterol: 194 mg/dL (ref 0–200)
HDL: 73.7 mg/dL (ref >39.00)
LDL Cholesterol: 109 mg/dL — ABNORMAL HIGH (ref 0–99)
NonHDL: 120.34
Triglycerides: 59 mg/dL (ref 0.0–149.0)
VLDL: 11.8 mg/dL (ref 0.0–40.0)

## 2017-09-07 LAB — TSH: TSH: 1.77 u[IU]/mL (ref 0.35–4.50)

## 2017-09-07 LAB — CYTOLOGY - PAP
DIAGNOSIS: NEGATIVE
HPV (WINDOPATH): NOT DETECTED

## 2017-09-18 DIAGNOSIS — Z1212 Encounter for screening for malignant neoplasm of rectum: Secondary | ICD-10-CM | POA: Diagnosis not present

## 2017-09-18 DIAGNOSIS — Z1211 Encounter for screening for malignant neoplasm of colon: Secondary | ICD-10-CM | POA: Diagnosis not present

## 2017-09-23 LAB — COLOGUARD: Cologuard: NEGATIVE

## 2017-09-26 ENCOUNTER — Encounter: Payer: Self-pay | Admitting: Nurse Practitioner

## 2017-09-26 NOTE — Progress Notes (Signed)
Abstracted result and sent to scan  

## 2017-09-27 NOTE — Telephone Encounter (Signed)
Pt is aware of result: negative.

## 2017-10-24 ENCOUNTER — Encounter: Payer: Self-pay | Admitting: Nurse Practitioner

## 2017-10-24 DIAGNOSIS — Z8669 Personal history of other diseases of the nervous system and sense organs: Secondary | ICD-10-CM

## 2017-10-25 MED ORDER — FLURBIPROFEN 100 MG PO TABS: 100.0000 mg | ORAL_TABLET | Freq: Two times a day (BID) | ORAL | 0 refills | Status: DC | PRN

## 2017-10-25 MED ORDER — RIZATRIPTAN BENZOATE 10 MG PO TABS: 10.0000 mg | ORAL_TABLET | ORAL | 0 refills | Status: DC | PRN

## 2017-10-25 NOTE — Telephone Encounter (Signed)
okey to refill, please advise.

## 2017-11-28 DIAGNOSIS — G43839 Menstrual migraine, intractable, without status migrainosus: Secondary | ICD-10-CM | POA: Diagnosis not present

## 2017-11-28 DIAGNOSIS — G43019 Migraine without aura, intractable, without status migrainosus: Secondary | ICD-10-CM | POA: Diagnosis not present

## 2017-11-28 DIAGNOSIS — Z049 Encounter for examination and observation for unspecified reason: Secondary | ICD-10-CM | POA: Diagnosis not present

## 2018-01-22 DIAGNOSIS — G43839 Menstrual migraine, intractable, without status migrainosus: Secondary | ICD-10-CM | POA: Diagnosis not present

## 2018-01-22 DIAGNOSIS — G43019 Migraine without aura, intractable, without status migrainosus: Secondary | ICD-10-CM | POA: Diagnosis not present

## 2018-04-24 DIAGNOSIS — G43019 Migraine without aura, intractable, without status migrainosus: Secondary | ICD-10-CM | POA: Diagnosis not present

## 2018-04-24 DIAGNOSIS — G43839 Menstrual migraine, intractable, without status migrainosus: Secondary | ICD-10-CM | POA: Diagnosis not present

## 2018-06-13 ENCOUNTER — Encounter: Payer: Self-pay | Admitting: Nurse Practitioner

## 2018-06-13 ENCOUNTER — Ambulatory Visit: Payer: BLUE CROSS/BLUE SHIELD | Admitting: Nurse Practitioner

## 2018-06-13 VITALS — BP 138/84 | HR 71 | Temp 98.4°F | Ht 65.0 in | Wt 144.8 lb

## 2018-06-13 DIAGNOSIS — Z23 Encounter for immunization: Secondary | ICD-10-CM

## 2018-06-13 DIAGNOSIS — H61892 Other specified disorders of left external ear: Secondary | ICD-10-CM

## 2018-06-13 NOTE — Patient Instructions (Addendum)
Apply triple antibiotics ointment x 2days then stop.  Your ear drum is intact.   Earache, Adult An earache, or ear pain, can be caused by many things, including:  An infection.  Ear wax buildup.  Ear pressure.  Something in the ear that should not be there (foreign body).  A sore throat.  Tooth problems.  Jaw problems.  Treatment of the earache will depend on the cause. If the cause is not clear or cannot be determined, you may need to watch your symptoms until your earache goes away or until a cause is found. Follow these instructions at home: Pay attention to any changes in your symptoms. Take these actions to help with your pain:  Take or apply over-the-counter and prescription medicines only as told by your health care provider.  If you were prescribed an antibiotic medicine, use it as told by your health care provider. Do not stop using the antibiotic even if you start to feel better.  Do not put anything in your ear other than medicine that is prescribed by your health care provider.  If directed, apply heat to the affected area as often as told by your health care provider. Use the heat source that your health care provider recommends, such as a moist heat pack or a heating pad. ? Place a towel between your skin and the heat source. ? Leave the heat on for 20-30 minutes. ? Remove the heat if your skin turns bright red. This is especially important if you are unable to feel pain, heat, or cold. You may have a greater risk of getting burned.  If directed, put ice on the ear: ? Put ice in a plastic bag. ? Place a towel between your skin and the bag. ? Leave the ice on for 20 minutes, 2-3 times a day.  Try resting in an upright position instead of lying down. This may help to reduce pressure in your ear and relieve pain.  Chew gum if it helps to relieve your ear pain.  Treat any allergies as told by your health care provider.  Keep all follow-up visits as told by  your health care provider. This is important.  Contact a health care provider if:  Your pain does not improve within 2 days.  Your earache gets worse.  You have new symptoms.  You have a fever. Get help right away if:  You have a severe headache.  You have a stiff neck.  You have trouble swallowing.  You have redness or swelling behind your ear.  You have fluid or blood coming from your ear.  You have hearing loss.  You feel dizzy. This information is not intended to replace advice given to you by your health care provider. Make sure you discuss any questions you have with your health care provider. Document Released: 03/11/2004 Document Revised: 03/22/2016 Document Reviewed: 01/18/2016 Elsevier Interactive Patient Education  [employer].

## 2018-06-13 NOTE — Progress Notes (Signed)
Subjective:  Patient ID: Rachael Scott, female    DOB: [birth year]  Age: 52 y.o. MRN: [id number]  CC: Ear Pain (patient is complaining of left ear pain all the time, little bleeding, started yesterday. )   Otalgia   There is pain in the left ear. This is a new problem. The current episode started yesterday. The problem occurs constantly. The problem has been unchanged. There has been no fever. Pertinent negatives include no coughing, ear discharge, hearing loss, neck pain, rash, sore throat or vomiting. She has tried nothing for the symptoms.  she noticed blood after scratching left ear.  Reviewed past Medical, Social and Family history today.  Outpatient Medications Prior to Visit  Medication Sig Dispense Refill  . acetaminophen (TYLENOL) 325 MG tablet Take 650 mg by mouth every 6 (six) hours as needed for pain.    Rachael Scott Kitchen amphetamine-dextroamphetamine (ADDERALL) 10 MG tablet Take by mouth.    Georjean Mode XR CS24 sprinkle cap TK 3 CS PO QD  3  . flurbiprofen (ANSAID) 100 MG tablet Take 1 tablet (100 mg total) by mouth 2 (two) times daily between meals as needed. (Patient not taking: Reported on 06/13/2018) 30 tablet 0  . promethazine (PHENERGAN) 25 MG tablet Take 25 mg by mouth every 6 (six) hours as needed for nausea or vomiting.    . rizatriptan (MAXALT) 10 MG tablet Take 1 tablet (10 mg total) by mouth as needed for migraine. May repeat in 2 hours if needed (Patient not taking: Reported on 06/13/2018) 10 tablet 0   No facility-administered medications prior to visit.     ROS See HPI  Objective:  BP 138/84   Pulse 71   Temp 98.4 F (36.9 C) (Oral)   Ht 5\' 5"  (1.651 m)   Wt 144 lb 12.8 oz (65.7 kg)   SpO2 99%   BMI 24.10 kg/m   BP Readings from Last 3 Encounters:  06/13/18 138/84  09/05/17 138/78  08/14/15 136/80    Wt Readings from Last 3 Encounters:  06/13/18 144 lb 12.8 oz (65.7 kg)  09/05/17 152 lb (68.9 kg)  05/21/13 140 lb (63.5 kg)    Physical Exam  HENT:  Right  Ear: Tympanic membrane, external ear and ear canal normal. No middle ear effusion.  Left Ear: Tympanic membrane and external ear normal.  No middle ear effusion.  Left ear canal with small abrasion on outer part of canal. No active bleeding.  Neck: Normal range of motion. Neck supple.  Cardiovascular: Normal rate.  Pulmonary/Chest: Effort normal.  Lymphadenopathy:    She has no cervical adenopathy.  Vitals reviewed.   Lab Results  Component Value Date   WBC 4.6 09/07/2017   HGB 14.2 09/07/2017   HCT 41.6 09/07/2017   PLT 201.0 09/07/2017   GLUCOSE 92 09/07/2017   CHOL 194 09/07/2017   TRIG 59.0 09/07/2017   HDL 73.70 09/07/2017   LDLCALC 109 (H) 09/07/2017   ALT 8 09/07/2017   AST 15 09/07/2017   NA 136 09/07/2017   K 4.5 09/07/2017   CL 104 09/07/2017   CREATININE 0.79 09/07/2017   BUN 17 09/07/2017   CO2 26 09/07/2017   TSH 1.77 09/07/2017    Assessment & Plan:   Rachael Scott was seen today for ear pain.  Diagnoses and all orders for this visit:  Irritation of external ear canal, left  Need for influenza vaccination -     Flu Vaccine QUAD 36+ mos IM   I am having Rachael Scott  A. Stemler maintain her acetaminophen, amphetamine-dextroamphetamine, promethazine, rizatriptan, flurbiprofen, and QUDEXY XR.  No orders of the defined types were placed in this encounter.   Follow-up: Return if symptoms worsen or fail to improve.  Wilfred Lacy, NP

## 2018-06-15 ENCOUNTER — Encounter: Payer: Self-pay | Admitting: Nurse Practitioner

## 2018-08-28 DIAGNOSIS — G43839 Menstrual migraine, intractable, without status migrainosus: Secondary | ICD-10-CM | POA: Diagnosis not present

## 2018-08-28 DIAGNOSIS — G43019 Migraine without aura, intractable, without status migrainosus: Secondary | ICD-10-CM | POA: Diagnosis not present

## 2019-01-30 DIAGNOSIS — Z03818 Encounter for observation for suspected exposure to other biological agents ruled out: Secondary | ICD-10-CM | POA: Diagnosis not present

## 2019-02-18 DIAGNOSIS — G43839 Menstrual migraine, intractable, without status migrainosus: Secondary | ICD-10-CM | POA: Diagnosis not present

## 2019-02-18 DIAGNOSIS — G43019 Migraine without aura, intractable, without status migrainosus: Secondary | ICD-10-CM | POA: Diagnosis not present

## 2019-02-22 DIAGNOSIS — I8311 Varicose veins of right lower extremity with inflammation: Secondary | ICD-10-CM | POA: Diagnosis not present

## 2019-02-22 DIAGNOSIS — I8312 Varicose veins of left lower extremity with inflammation: Secondary | ICD-10-CM | POA: Diagnosis not present

## 2019-03-07 DIAGNOSIS — I8312 Varicose veins of left lower extremity with inflammation: Secondary | ICD-10-CM | POA: Diagnosis not present

## 2019-05-17 DIAGNOSIS — I8312 Varicose veins of left lower extremity with inflammation: Secondary | ICD-10-CM | POA: Diagnosis not present

## 2019-06-07 ENCOUNTER — Other Ambulatory Visit: Payer: Self-pay

## 2019-06-07 DIAGNOSIS — Z20822 Contact with and (suspected) exposure to covid-19: Secondary | ICD-10-CM

## 2019-06-09 LAB — NOVEL CORONAVIRUS, NAA: SARS-CoV-2, NAA: NOT DETECTED

## 2019-08-13 DIAGNOSIS — G43839 Menstrual migraine, intractable, without status migrainosus: Secondary | ICD-10-CM | POA: Diagnosis not present

## 2019-08-13 DIAGNOSIS — G43019 Migraine without aura, intractable, without status migrainosus: Secondary | ICD-10-CM | POA: Diagnosis not present

## 2019-09-23 ENCOUNTER — Ambulatory Visit (INDEPENDENT_AMBULATORY_CARE_PROVIDER_SITE_OTHER)
Admission: RE | Admit: 2019-09-23 | Discharge: 2019-09-23 | Disposition: A | Discharge status: Home / Self Care | Payer: BLUE CROSS/BLUE SHIELD | Source: Ambulatory Visit

## 2019-09-23 DIAGNOSIS — R519 Headache, unspecified: Secondary | ICD-10-CM | POA: Diagnosis not present

## 2019-09-23 MED ORDER — PROMETHAZINE HCL 25 MG PO TABS: 25.0000 mg | ORAL_TABLET | Freq: Four times a day (QID) | ORAL | 0 refills | Status: AC | PRN

## 2019-09-23 NOTE — Discharge Instructions (Signed)
Keep taking the Maxalt as prescribed Phenergan for nausea Flonase and zyrtec OTC for allergies.  Follow up as needed for continued or worsening symptoms

## 2019-09-23 NOTE — ED Provider Notes (Signed)
Virtual Visit via Video Note:  Rachael Scott  initiated request for Telemedicine visit with [facility] team. I connected with Rachael Scott  on 09/24/2019 at 10:06 AM  for a synchronized telemedicine visit using a video enabled HIPPA compliant telemedicine application. I verified that I am speaking with Rachael Scott  using two identifiers. Orvan July, NP  was physically located in a [facility]gent care site and Rachael Scott was located at a different location.   The limitations of evaluation and management by telemedicine as well as the availability of in-person appointments were discussed. Patient was informed that she  may incur a bill ( including co-pay) for this virtual visit encounter. Rachael Scott  expressed understanding and gave verbal consent to proceed with virtual visit.     History of Present Illness:Rachael Scott  is a 54 y.o. female presents with headache.  Headache has been constant, behind the right eye and right frontal area  for approximate 3 days.  Symptoms been constant, waxing waning.  Describes as throbbing.  She has had some associated nausea.  She is prescribed Maxalt for migraines and she did take this but got extremely nauseous and is concerned about taking this medication again.  Mild photophobia but no phonophobia.  Denies any vision changes.  Denies any injury to the eye or eye pain.  He also has some mild sinusitis type symptoms.  Past Medical History:  Diagnosis Date  . ADD (attention deficit disorder)   . Gallbladder disease   . Headache    3 a mo  . History of chickenpox     No Known Allergies      Observations/Objective:VITALS: Per patient if applicable, see vitals. GENERAL: Alert, appears well and in no acute distress. HEENT: Atraumatic, conjunctiva clear, no obvious abnormalities on inspection of external nose and ears. NECK: Normal movements of the head and neck. CARDIOPULMONARY: No increased WOB. Speaking in clear sentences.  I:E ratio WNL.  MS: Moves all visible extremities without noticeable abnormality. PSYCH: Pleasant and cooperative, well-groomed. Speech normal rate and rhythm. Affect is appropriate. Insight and judgement are appropriate. Attention is focused, linear, and appropriate.  NEURO: CN grossly intact. Oriented as arrived to appointment on time with no prompting. Moves both UE equally.  SKIN: No obvious lesions, wounds, erythema, or cyanosis noted on face or hands.     Assessment and Plan: Migraine-we will have her continue using the Maxalt.  Refilled Phenergan for nausea. Recommended Flonase and Zyrtec over-the-counter in case this is sinus related.    Follow Up Instructions: Return for evaluation in person if symptoms continue or worsen.    I discussed the assessment and treatment plan with the patient. The patient was provided an opportunity to ask questions and all were answered. The patient agreed with the plan and demonstrated an understanding of the instructions.   The patient was advised to call back or seek an in-person evaluation if the symptoms worsen or if the condition fails to improve as anticipated.   Orvan July, NP  09/24/2019 10:06 AM         Orvan July, NP 09/24/19 1006

## 2020-03-02 DIAGNOSIS — G43839 Menstrual migraine, intractable, without status migrainosus: Secondary | ICD-10-CM | POA: Diagnosis not present

## 2020-03-02 DIAGNOSIS — G43019 Migraine without aura, intractable, without status migrainosus: Secondary | ICD-10-CM | POA: Diagnosis not present

## 2020-03-04 ENCOUNTER — Other Ambulatory Visit: Payer: Self-pay

## 2020-03-04 ENCOUNTER — Ambulatory Visit
Admission: RE | Admit: 2020-03-04 | Discharge: 2020-03-04 | Disposition: A | Discharge status: Home / Self Care | Payer: BC Managed Care – PPO | Source: Ambulatory Visit | Attending: Physician Assistant | Admitting: Physician Assistant

## 2020-03-04 VITALS — BP 188/109 | HR 89 | Temp 98.3°F | Resp 18

## 2020-03-04 DIAGNOSIS — B029 Zoster without complications: Secondary | ICD-10-CM

## 2020-03-04 MED ORDER — VALACYCLOVIR HCL 1 G PO TABS: 1000.0000 mg | ORAL_TABLET | Freq: Three times a day (TID) | ORAL | 0 refills | Status: AC

## 2020-03-04 NOTE — ED Triage Notes (Signed)
Pt c/o lt side of neck pain since last Thursday. When to neurologist on Monday and states a strain in neck. States rash to lt chest/neck/shoulder since last night.

## 2020-03-04 NOTE — ED Provider Notes (Signed)
[facility]    CSN: [id number] Arrival date & time: 03/04/20  1051      History   Chief Complaint Chief Complaint  Patient presents with  . Rash    HPI Rachael Scott is a 54 y.o. female.   54 year old female comes in for left sided rash to the neck/shoulder/chest. States this first started with neck pain 6 days ago and thought this was due to neck strain. Yesterday with vesicular rash to the left side of the neck/shoulder/chest with shooting pain to the scalp. Denies fever, URI symptoms.     Past Medical History:  Diagnosis Date  . ADD (attention deficit disorder)   . Gallbladder disease   . Headache    3 a mo  . History of chickenpox     Patient Active Problem List   Diagnosis Date Noted  . Hx of migraine headaches 09/06/2017  . Plantar fascial fibromatosis 05/21/2013  . 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 CESAREAN SECTION  1992  . CHOLECYSTECTOMY    . ENDOMETRIAL ABLATION    . tummy  2001   tummy tuck    OB History   No obstetric history on file.      Home Medications    Prior to Admission medications   Medication Sig Start Date End Date Taking? Authorizing Provider  acetaminophen (TYLENOL) 325 MG tablet Take 650 mg by mouth every 6 (six) hours as needed for pain.    [provider]  amphetamine-dextroamphetamine (ADDERALL) 10 MG tablet Take by mouth.    [provider]  promethazine (PHENERGAN) 25 MG tablet Take 1 tablet (25 mg total) by mouth every 6 (six) hours as needed for nausea or vomiting. 09/23/19   Loura Halt A, NP  QUDEXY XR CS24 sprinkle cap TK 3 CS PO QD 05/31/18   [provider]  valACYclovir (VALTREX) 1000 MG tablet Take 1 tablet (1,000 mg total) by mouth 3 (three) times daily for 7 days. 03/04/20 03/11/20  Ok Edwards, PA-C    Family History Family History  Problem Relation Age of Onset  . Hypothyroidism  Sister   . Hypertension Mother   . Hypertension Father     Social History Social History   Tobacco Use  . Smoking status: Former Smoker    Packs/day: 0.50    Years: 15.00    Pack years: 7.50    Types: Cigarettes    Quit date: 08/01/2003    Years since quitting: 16.6  . Smokeless tobacco: Never Used  Vaping Use  . Vaping Use: Never used  Substance Use Topics  . Alcohol use: Yes    Comment:  rare - once a month glass of wine  . Drug use: No     Allergies   Patient has no known allergies.   Review of Systems Review of Systems  Reason unable to perform ROS: See HPI as above.     Physical Exam Triage Vital Signs ED Triage Vitals  Enc Vitals Group     BP 03/04/20 1112 (!) 188/109     Pulse Rate 03/04/20 1112 89     Resp 03/04/20 1112 18     Temp 03/04/20 1112 98.3 F (36.8 C)     Temp src --      SpO2 03/04/20 1112 98 %     Weight --      Height --  Head Circumference --      Peak Flow --      Pain Score 03/04/20 1131 8     Pain Loc --      Pain Edu? --      Excl. in [place]? --    No data found.  Updated Vital Signs BP (!) 188/109 (BP Location: Right Arm)   Pulse 89   Temp 98.3 F (36.8 C)   Resp 18   LMP 02/19/2020   SpO2 98%   Physical Exam Constitutional:      General: She is not in acute distress.    Appearance: Normal appearance. She is well-developed. She is not toxic-appearing or diaphoretic.  HENT:     Head: Normocephalic and atraumatic.     Right Ear: Tympanic membrane, ear canal and external ear normal.     Left Ear: Tympanic membrane, ear canal and external ear normal.     Ears:     Comments: No rashes seen to the ear canal Eyes:     Extraocular Movements: Extraocular movements intact.     Conjunctiva/sclera: Conjunctivae normal.     Pupils: Pupils are equal, round, and reactive to light.  Pulmonary:     Effort: Pulmonary effort is normal. No respiratory distress.  Musculoskeletal:     Cervical back: Normal range of motion and  neck supple.  Skin:    General: Skin is warm and dry.     Comments: Vesicular rash in likely C5 distribution without erythema, warmth. Rash not extending to the face, ear  Neurological:     Mental Status: She is alert and oriented to person, place, and time.      UC Treatments / Results  Labs (all labs ordered are listed, but only abnormal results are displayed) Labs Reviewed - No data to display  EKG   Radiology No results found.  Procedures Procedures (including critical care time)  Medications Ordered in UC Medications - No data to display  Initial Impression / Assessment and Plan / UC Course  I have reviewed the triage vital signs and the nursing notes.  Pertinent labs & imaging results that were available during my care of the patient were reviewed by me and considered in my medical decision making (see chart for details).    Valacyclovir as directed. Symptomatic treatment discussed. Follow up with PCP in 1 week for reevaluation. Return precautions given.  Final Clinical Impressions(s) / UC Diagnoses   Final diagnoses:  Herpes zoster without complication   ED Prescriptions    Medication Sig Dispense Auth. Provider   valACYclovir (VALTREX) 1000 MG tablet Take 1 tablet (1,000 mg total) by mouth 3 (three) times daily for 7 days. 21 tablet Ok Edwards, PA-C     PDMP not reviewed this encounter.   Ok Edwards, PA-C 03/04/20 1240

## 2020-03-04 NOTE — Discharge Instructions (Signed)
Start valacyclovir as directed. Follow up with PCP for recheck in 1 week. Otherwise, go to the ED if worsening symptoms, fever, dizziness, spreading rash

## 2020-03-11 ENCOUNTER — Encounter: Payer: Self-pay | Admitting: Nurse Practitioner

## 2020-03-11 ENCOUNTER — Ambulatory Visit: Payer: BC Managed Care – PPO | Admitting: Nurse Practitioner

## 2020-03-11 ENCOUNTER — Other Ambulatory Visit: Payer: Self-pay

## 2020-03-11 VITALS — BP 138/82 | HR 92 | Temp 97.0°F | Ht 65.0 in | Wt 150.2 lb

## 2020-03-11 DIAGNOSIS — B0229 Other postherpetic nervous system involvement: Secondary | ICD-10-CM | POA: Diagnosis not present

## 2020-03-11 MED ORDER — GABAPENTIN 100 MG PO CAPS: ORAL_CAPSULE | ORAL | 1 refills | Status: DC

## 2020-03-11 NOTE — Progress Notes (Signed)
Subjective:  Patient ID: Rachael Scott, female    DOB: 1966-07-22  Age: 54 y.o. MRN: [id number]  CC: Acute Visit (shingles f/up from [facility]-pt said Valtrex worked, pt said she still has some pain an itching)   Rash This is a new problem. The current episode started in the past 7 days. The problem has been gradually improving since onset. The affected locations include the neck. Pertinent negatives include no anorexia, eye pain, facial edema, fatigue, fever, shortness of breath or sore throat. Treatments tried: valtrex. The treatment provided moderate relief. Her past medical history is significant for varicella.   Reviewed past Medical, Social and Family history today.  Outpatient Medications Prior to Visit  Medication Sig Dispense Refill  . acetaminophen (TYLENOL) 325 MG tablet Take 650 mg by mouth every 6 (six) hours as needed for pain.    Marland Kitchen amphetamine-dextroamphetamine (ADDERALL) 10 MG tablet Take by mouth.    Marland Kitchen imipramine (TOFRANIL) 50 MG tablet Take 50 mg by mouth daily.    . promethazine (PHENERGAN) 25 MG tablet Take 1 tablet (25 mg total) by mouth every 6 (six) hours as needed for nausea or vomiting. 30 tablet 0  . valACYclovir (VALTREX) 1000 MG tablet Take 1 tablet (1,000 mg total) by mouth 3 (three) times daily for 7 days. 21 tablet 0  . QUDEXY XR CS24 sprinkle cap TK 3 CS PO QD (Patient not taking: Reported on 03/11/2020)  3   No facility-administered medications prior to visit.    ROS See HPI  Objective:  BP 138/82   Pulse 92   Temp (!) 97 F (36.1 C) (Tympanic)   Ht 5\' 5"  (1.651 m)   Wt 150 lb 3.2 oz (68.1 kg)   LMP 02/19/2020   SpO2 100%   BMI 24.99 kg/m   Physical Exam HENT:     Right Ear: External ear normal.     Left Ear: External ear normal.  Eyes:     General: No scleral icterus.    Extraocular Movements: Extraocular movements intact.     Conjunctiva/sclera: Conjunctivae normal.  Cardiovascular:     Rate and Rhythm: Normal rate.     Pulses: Normal  pulses.  Pulmonary:     Effort: Pulmonary effort is normal.  Musculoskeletal:     Cervical back: Normal range of motion and neck supple.  Lymphadenopathy:     Cervical: No cervical adenopathy.  Skin:    Findings: Rash present. No erythema. Rash is crusting and vesicular.       Neurological:     Mental Status: She is alert and oriented to person, place, and time.  Psychiatric:        Mood and Affect: Mood normal.        Behavior: Behavior normal.        Thought Content: Thought content normal.     Assessment & Plan:  This visit occurred during the SARS-CoV-2 public health emergency.  Safety protocols were in place, including screening questions prior to the visit, additional usage of staff PPE, and extensive cleaning of exam room while observing appropriate contact time as indicated for disinfecting solutions.   Rachael Scott was seen today for acute visit.  Diagnoses and all orders for this visit:  Post herpetic neuralgia -     gabapentin (NEURONTIN) 100 MG capsule; Take 1 capsule (100 mg total) by mouth at bedtime for 3 days, THEN 1 capsule (100 mg total) 2 (two) times daily for 27 days.    Problem List Items Addressed  This Visit    None    Visit Diagnoses    Post herpetic neuralgia    -  Primary   Relevant Medications   gabapentin (NEURONTIN) 100 MG capsule      Follow-up: No follow-ups on file.  Wilfred Lacy, NP

## 2020-03-11 NOTE — Patient Instructions (Signed)
Do not stop gabapentin abruptly. Need to wean off after taking for more than 2weeks. Schedule nurse visit for Shringrix vaccine in 39month   Ramsay Hunt Syndrome  Ramsay Hunt syndrome (RHS) is a viral infection that affects the nerves in the face and the nerves near the inner ear. The infection is caused by the varicella zoster virus (VZV). This is the same virus that causes chickenpox and shingles. After a person has chickenpox, this virus may become inactive. Years later, the virus can become active again and cause Ramsay Hunt syndrome. The trigger may be something that weakens the body's defense system (immune system), like stress. When VZV becomes activated, it moves up the facial nerve and causes a painful rash in or around the ear canal. It may also travel up the nerve that is responsible for hearing. Ramsay Hunt syndrome cannot be passed from person to person (it is not contagious). However, if a person who has never had chickenpox comes in contact with fluid from someone's skin blisters, he or she may develop chickenpox. What are the causes? This condition is caused by the varicella zoster virus. What increases the risk? You may be at risk for Ramsay Hunt syndrome if you have had chickenpox in the past. What are the signs or symptoms? Symptoms of this condition include:  A rash with blisters that breaks out around the ear. The rash may be accompanied by: ? A rash in the inner ear, along the side of the face, or up the scalp. ? A rash inside the mouth. ? Deep and severe pain in the ear. ? Severe, burning pain wherever the rash develops.  Facial nerve weakness. This may cause: ? Drooping on one side of the face. ? Inability to close the eyelid on the affected side of the face. ? Trouble eating. ? Loss of ability to taste on the side of the tongue. If RHS affects the inner ear nerve (auditory nerve), other symptoms may be present. These may include:  Hearing loss.  A spinning  sensation (vertigo).  Clumsiness.  Ringing in the ear (tinnitus). How is this diagnosed? This condition may be diagnosed based on:  Your symptoms.  A physical exam.  Other tests to confirm the diagnosis. These may include: ? Viral culture. This test is done by swabbing the rash or blister to check for VZV. ? Blood tests to check for antibodies to VZV. Antibodies are proteins that your body produces in response to germs. ? Nerve conduction studies (electroneurogram). ? MRI scan. ? Hearing tests (audiology). How is this treated? This condition may be treated with:  An antiviral medicine to treat the virus.  NSAIDs or prescription pain relievers to control pain.  An anti-inflammatory medicine (steroid) called prednisone.  Antibiotic medicine, if the rash becomes infected. If treatment starts within the first 3 days of having symptoms, it will shorten the course of the pain and rash that is caused by RHS. Treatment will also prevent your facial nerve from continuing to weaken. Without treatment, it is possible that you may not recover full use of your facial nerve. Follow these instructions at home: Medicines  Take over-the-counter and prescription medicines only as told by your health care provider.  If you were prescribed an antibiotic or antiviral medicine, take or apply it as told by your health care provider. Do not stop using the antibiotic or antiviral medicine even if your condition improves.  Do not drive or use heavy machinery while taking prescription pain medicine.  If  you are taking prescription pain medicine, take actions to prevent or treat constipation. Your health care provider may recommend that you: ? Drink enough fluid to keep your urine pale yellow. ? Eat foods that are high in fiber, such as fresh fruits and vegetables, whole grains, and beans. ? Limit foods that are high in fat and processed sugars, such as fried or sweet foods. ? Take an over-the-counter  or prescription medicine for constipation. General instructions  If told by your health care provider, use artificial tears and wear an eye patch to protect your eye until you can close your eyelid again.  Do not scratch or pick at the rash.  Put a cold, wet cloth (cold compress) on the itchy area as told by your health care provider.  If you have blisters in your mouth, do not eat or drink spicy, salty, or acidic things. Soft, bland, and cold foods and beverages are easiest to swallow.  Keep all follow-up visits as told by your health care provider. This is important. Contact a health care provider if:  Your pain medicine is not helping.  You have chills or fever.  Your symptoms get worse.  Your symptoms have not gone away after 2 Barocio.  You have any changes in vision. Summary  Ramsay Hunt syndrome (RHS) is a viral infection that affects the nerves in the face and the nerves near the inner ear. The infection is caused by the varicella zoster virus (VZV), which also causes chicken pox and shingles.  After a person has chickenpox, this virus may become inactive. If the inactive VZV virus becomes activated, it moves up the facial nerve and causes a painful rash in or around the ear canal. It can also cause hearing loss and facial nerve weakness, with drooping on one side of the face.  If treatment starts within the first 3 days of having symptoms, it will shorten the course of the pain and rash that are caused by RHS. Treatment will also prevent your facial nerve from continuing to weaken.  Treatments may include antiviral, steroid, and pain medicines. This information is not intended to replace advice given to you by your health care provider. Make sure you discuss any questions you have with your health care provider. Document Revised: 09/20/2018 Document Reviewed: 09/07/2017 Elsevier Patient Education  2020 [employer].

## 2020-03-17 ENCOUNTER — Encounter: Payer: Self-pay | Admitting: Nurse Practitioner

## 2020-05-14 ENCOUNTER — Other Ambulatory Visit: Payer: Self-pay | Admitting: Nurse Practitioner

## 2020-05-14 DIAGNOSIS — B0229 Other postherpetic nervous system involvement: Secondary | ICD-10-CM

## 2020-06-08 ENCOUNTER — Encounter: Payer: Self-pay | Admitting: Family Medicine

## 2020-06-08 ENCOUNTER — Telehealth (INDEPENDENT_AMBULATORY_CARE_PROVIDER_SITE_OTHER): Payer: BC Managed Care – PPO | Admitting: Family Medicine

## 2020-06-08 VITALS — Temp 100.5°F | Ht 65.0 in | Wt 148.0 lb

## 2020-06-08 DIAGNOSIS — N3000 Acute cystitis without hematuria: Secondary | ICD-10-CM | POA: Insufficient documentation

## 2020-06-08 MED ORDER — NITROFURANTOIN MONOHYD MACRO 100 MG PO CAPS: 100.0000 mg | ORAL_CAPSULE | Freq: Two times a day (BID) | ORAL | 0 refills | Status: AC

## 2020-06-08 NOTE — Progress Notes (Signed)
Established Patient Office Visit  Subjective:  Patient ID: Rachael Scott, female    DOB: 09-17-1965  Age: 54 y.o. MRN: [id number]  CC:  Chief Complaint  Patient presents with  . Fever    fever and dysuria symptoms started yesterday. Highest temp 101.5     HPI Rachael Scott presents for 2 d history of fever, dysuria, and urinary frequency. Without flank pain, n/v, or vaginal discharge. Complains of malaise. Recovering from recent zoster infection.   Past Medical History:  Diagnosis Date  . ADD (attention deficit disorder)   . Gallbladder disease   . Headache    3 a mo  . History of chickenpox     Past Surgical History:  Procedure Laterality Date  . CESAREAN SECTION  1992  . CHOLECYSTECTOMY    . ENDOMETRIAL ABLATION    . tummy  2001   tummy tuck    Family History  Problem Relation Age of Onset  . Hypothyroidism Sister   . Hypertension Mother   . Hypertension Father     Social History   Socioeconomic History  . Marital status: Married    Spouse name: Not on file  . Number of children: Not on file  . Years of education: Not on file  . Highest education level: Not on file  Occupational History  . Not on file  Tobacco Use  . Smoking status: Former Smoker    Packs/day: 0.50    Years: 15.00    Pack years: 7.50    Types: Cigarettes    Quit date: 08/01/2003    Years since quitting: 16.8  . Smokeless tobacco: Never Used  Vaping Use  . Vaping Use: Never used  Substance and Sexual Activity  . Alcohol use: Yes    Comment:  rare - once a month glass of wine  . Drug use: No  . Sexual activity: Not on file  Other Topics Concern  . Not on file  Social History Narrative  . Not on file   Social Determinants of Health   Financial Resource Strain:   . Difficulty of Paying Living Expenses: Not on file  Food Insecurity:   . Worried About Charity fundraiser in the Last Year: Not on file  . Ran Out of Food in the Last Year: Not on file  Transportation Needs:     . Lack of Transportation (Medical): Not on file  . Lack of Transportation (Non-Medical): Not on file  Physical Activity:   . Days of Exercise per Week: Not on file  . Minutes of Exercise per Session: Not on file  Stress:   . Feeling of Stress : Not on file  Social Connections:   . Frequency of Communication with Friends and Family: Not on file  . Frequency of Social Gatherings with Friends and Family: Not on file  . Attends Religious Services: Not on file  . Active Member of Clubs or Organizations: Not on file  . Attends Archivist Meetings: Not on file  . Marital Status: Not on file  Intimate Partner Violence:   . Fear of Current or Ex-Partner: Not on file  . Emotionally Abused: Not on file  . Physically Abused: Not on file  . Sexually Abused: Not on file    Outpatient Medications Prior to Visit  Medication Sig Dispense Refill  . acetaminophen (TYLENOL) 325 MG tablet Take 650 mg by mouth every 6 (six) hours as needed for pain.    Marland Kitchen amphetamine-dextroamphetamine (ADDERALL) 10  MG tablet Take by mouth.    Marland Kitchen imipramine (TOFRANIL) 50 MG tablet Take 50 mg by mouth daily.    . promethazine (PHENERGAN) 25 MG tablet Take 1 tablet (25 mg total) by mouth every 6 (six) hours as needed for nausea or vomiting. 30 tablet 0  . gabapentin (NEURONTIN) 100 MG capsule Take 1 capsule (100 mg total) by mouth 2 (two) times daily. 60 capsule 2  . QUDEXY XR CS24 sprinkle cap TK 3 CS PO QD (Patient not taking: Reported on 03/11/2020)  3   No facility-administered medications prior to visit.    No Known Allergies  ROS Review of Systems  Constitutional: Positive for fatigue and fever. Negative for chills and diaphoresis.  HENT: Negative.  Negative for congestion, postnasal drip and sore throat.   Eyes: Negative for photophobia and visual disturbance.  Respiratory: Negative.  Negative for cough.   Cardiovascular: Negative.   Gastrointestinal: Negative for abdominal pain, nausea and vomiting.   Genitourinary: Positive for dysuria, frequency and urgency. Negative for difficulty urinating, flank pain, genital sores, vaginal bleeding and vaginal discharge.  Musculoskeletal: Negative for back pain.  Allergic/Immunologic: Negative for immunocompromised state.  Neurological: Negative.   Hematological: Negative.   Psychiatric/Behavioral: Negative.       Objective:    Physical Exam Vitals and nursing note reviewed.  Constitutional:      General: She is not in acute distress.    Appearance: Normal appearance. She is not ill-appearing, toxic-appearing or diaphoretic.  HENT:     Head: Normocephalic and atraumatic.     Right Ear: External ear normal.     Left Ear: External ear normal.  Eyes:     General: No scleral icterus.       Right eye: No discharge.        Left eye: No discharge.     Conjunctiva/sclera: Conjunctivae normal.  Pulmonary:     Effort: Pulmonary effort is normal.  Neurological:     Mental Status: She is alert and oriented to person, place, and time.  Psychiatric:        Mood and Affect: Mood normal.        Behavior: Behavior normal.     Temp (!) 100.5 F (38.1 C) (Tympanic)   Ht 5\' 5"  (1.651 m)   Wt 148 lb (67.1 kg)   BMI 24.63 kg/m  Wt Readings from Last 3 Encounters:  06/08/20 148 lb (67.1 kg)  03/11/20 150 lb 3.2 oz (68.1 kg)  06/13/18 144 lb 12.8 oz (65.7 kg)     Health Maintenance Due  Topic Date Due  . Hepatitis C Screening  Never done  . COVID-19 Vaccine (1) Never done  . HIV Screening  Never done  . TETANUS/TDAP  Never done  . MAMMOGRAM  Never done  . COLONOSCOPY  Never done  . INFLUENZA VACCINE  03/08/2020    There are no preventive care reminders to display for this patient.  Lab Results  Component Value Date   TSH 1.77 09/07/2017   Lab Results  Component Value Date   WBC 4.6 09/07/2017   HGB 14.2 09/07/2017   HCT 41.6 09/07/2017   MCV 91.1 09/07/2017   PLT 201.0 09/07/2017   Lab Results  Component Value Date   NA  136 09/07/2017   K 4.5 09/07/2017   CO2 26 09/07/2017   GLUCOSE 92 09/07/2017   BUN 17 09/07/2017   CREATININE 0.79 09/07/2017   BILITOT 0.8 09/07/2017   ALKPHOS 43 09/07/2017   AST  15 09/07/2017   ALT 8 09/07/2017   PROT 7.0 09/07/2017   ALBUMIN 4.2 09/07/2017   CALCIUM 9.1 09/07/2017   ANIONGAP 10 08/14/2015   GFR 81.39 09/07/2017   Lab Results  Component Value Date   CHOL 194 09/07/2017   Lab Results  Component Value Date   HDL 73.70 09/07/2017   Lab Results  Component Value Date   LDLCALC 109 (H) 09/07/2017   Lab Results  Component Value Date   TRIG 59.0 09/07/2017   Lab Results  Component Value Date   CHOLHDL 3 09/07/2017   No results found for: HGBA1C    Assessment & Plan:   Problem List Items Addressed This Visit      Genitourinary   Acute cystitis without hematuria - Primary   Relevant Medications   nitrofurantoin, macrocrystal-monohydrate, (MACROBID) 100 MG capsule   Other Relevant Orders   Urinalysis, Routine w reflex microscopic   Urine Culture      Meds ordered this encounter  Medications  . nitrofurantoin, macrocrystal-monohydrate, (MACROBID) 100 MG capsule    Sig: Take 1 capsule (100 mg total) by mouth 2 (two) times daily for 7 days.    Dispense:  14 capsule    Refill:  0    Follow-up: Return in about 1 week (around 06/15/2020), or if symptoms worsen or fail to improve.   Bring urine for culture tomorrow. Libby Maw, MD   Virtual Visit via Video Note  I connected with Calton Dach Burgueno on 06/08/20 at  3:30 PM EDT by a video enabled telemedicine application and verified that I am speaking with the correct person using two identifiers.  Location: Patient: home alone Provider: home   I discussed the limitations of evaluation and management by telemedicine and the availability of in person appointments. The patient expressed understanding and agreed to proceed.  History of Present Illness:     Observations/Objective:   Assessment and Plan:   Follow Up Instructions:    I discussed the assessment and treatment plan with the patient. The patient was provided an opportunity to ask questions and all were answered. The patient agreed with the plan and demonstrated an understanding of the instructions.   The patient was advised to call back or seek an in-person evaluation if the symptoms worsen or if the condition fails to improve as anticipated.  I provided 20 minutes of non-face-to-face time during this encounter.   Libby Maw, MD Virtual Visit via Video Note  I connected with Calton Dach Krichbaum on 06/08/20 at  3:30 PM EDT by a video enabled telemedicine application and verified that I am speaking with the correct person using two identifiers.  Location: Patient: home alone  Provider: home   I discussed the limitations of evaluation and management by telemedicine and the availability of in person appointments. The patient expressed understanding and agreed to proceed.  History of Present Illness:    Observations/Objective:   Assessment and Plan:   Follow Up Instructions:    I discussed the assessment and treatment plan with the patient. The patient was provided an opportunity to ask questions and all were answered. The patient agreed with the plan and demonstrated an understanding of the instructions.   The patient was advised to call back or seek an in-person evaluation if the symptoms worsen or if the condition fails to improve as anticipated.  I provided 20 minutes of non-face-to-face time during this encounter.   Libby Maw, MD

## 2020-06-09 ENCOUNTER — Other Ambulatory Visit: Payer: Self-pay

## 2020-06-09 ENCOUNTER — Other Ambulatory Visit (INDEPENDENT_AMBULATORY_CARE_PROVIDER_SITE_OTHER): Payer: BC Managed Care – PPO

## 2020-06-09 DIAGNOSIS — N3000 Acute cystitis without hematuria: Secondary | ICD-10-CM | POA: Diagnosis not present

## 2020-06-09 LAB — URINALYSIS, ROUTINE W REFLEX MICROSCOPIC
Ketones, ur: 15 — AB
Nitrite: NEGATIVE
Specific Gravity, Urine: 1.02 (ref 1.000–1.030)
Total Protein, Urine: NEGATIVE
Urine Glucose: NEGATIVE
Urobilinogen, UA: 0.2 (ref 0.0–1.0)
pH: 5.5 (ref 5.0–8.0)

## 2020-09-24 NOTE — ED Notes (Signed)
ED Triage Note       ED Secondary Triage Entered On:  09/24/2020 14:28 EST    Performed On:  09/24/2020 14:27 EST by Julien Girt, RN, Clyde Canterbury               General Information   Barriers to Learning :   None evident   ED Home Meds Section :   Document assessment   [facility] ED Fall Risk Section :   Document assessment   ED Advance Directives Section :   Document assessment   ED Palliative Screen :   N/A (prefilled for <65yo)   Julien Girt, RN, Darcy A - 09/24/2020 14:27 EST   (As Of: 09/24/2020 14:28:45 EST)   Diagnoses(Active)    Closed head injury without LOC  Date:   09/24/2020 ; Diagnosis Type:   Reason For Visit ; Confirmation:   Complaint of ; Clinical Dx:   Closed head injury without LOC ; Classification:   Medical ; Clinical Service:   Emergency medicine ; Code:   PNED ; Probability:   0 ; Diagnosis Code:   [id number]             -    Procedure History   (As Of: 09/24/2020 14:28:45 EST)     [facility]ch Fall Risk Assessment Tool   Hx of falling last 3 months ED Fall :   No   Patient confused or disoriented ED Fall :   No   Patient intoxicated or sedated ED Fall :   No   Patient impaired gait ED Fall :   No   Use a mobility assistance device ED Fall :   No   Patient altered elimination ED Fall :   No   [facility] ED Fall Score :   0    Julien Girt RNJewel Baize A - 09/24/2020 14:27 EST   ED Advance Directive   Advance Directive :   No   Julien Girt, RN, Jewel Baize A - 09/24/2020 14:27 EST

## 2020-09-24 NOTE — ED Notes (Signed)
 ED Patient Summary       ;       [facility] Emergency Department  [address], [place] [address]  [phone]  Discharge Instructions (Patient)  Name: Rhonda Carroll, Rhonda Carroll  DOB:  April 30, 1966                   MRN: [id number]                   FIN: [id number]  Reason For Visit: Closed head injury without LOC; FALL/HIT HEAD  Final Diagnosis: Closed head injury     Visit Date: 09/24/2020 13:11:00  Address: [address] [place] [address]  Phone: [phone]     Emergency Department Providers:         Primary Physician:      GWENN PONCE DEMPSEY PONCE         [facility] would like to thank you for allowing us  to assist you with your healthcare needs. The following includes patient education materials and information regarding your injury/illness.     Follow-up Instructions:  You were seen today on an emergency basis. Please contact your primary care doctor for a follow up appointment. If you received a referral to a specialist doctor, it is important you follow-up as instructed.    It is important that you call your follow-up doctor to schedule and confirm the location of your next appointment. Your doctor may practice at multiple locations. The office location of your follow-up appointment may be different to the one written on your discharge instructions.    If you do not have a primary care doctor, please call [phone] for help in finding a [facility] Provider. For help in finding a specialist doctor, please call [phone].    If your condition gets worse before your follow-up with your primary care doctor or specialist, please return to the Emergency Department.      Coronavirus 2019 (COVID-19) Reminders:     Patients age 61 - 25, with parental consent, and patients over age 4 can make an appointment for a COVID-19 vaccine. Patients can contact their [facility] doctors' offices to schedule an appointment to receive the COVID-19 vaccine. Patients  who do not have a [facility] physician can call [phone] to schedule vaccination appointments.      Follow Up Appointments:  Primary Care Provider:     Name: PCP,  NONE     Phone:                  With: Address: When:   Follow up with primary care provider  Within 1 week   Comments:   Return to ED if symptoms worsen                   Medications that have not changed  Other Medications  imipramine (imipramine 50 mg oral tablet) TAKE 1 TABLET BY MOUTH DAILY.  Last Dose:____________________  rizatriptan (rizatriptan 10 mg oral tablet) TAKE 1 TABLET BY MOUTH AS NEEDED FOR MIGRAINE.  Last Dose:____________________      Allergy Info: No Known Allergies     Discharge Additional Information          Discharge Patient 09/24/20 15:13:00 EST      Patient Education Materials:        Head Injury, Adult      There are many types of head injuries. They can be as minor as  a small bump. Some head injuries can be worse. Worse injuries include:   A strong hit to the head that shakes the brain back and forth, causing damage (concussion).     A bruise (contusion) of the brain. This means there is bleeding in the brain that can cause swelling.     A cracked skull (skull fracture).     Bleeding in the brain that gathers, gets thick (makes a clot), and forms a bump (hematoma).      Most problems from a head injury come in the first 24 hours. However, you may still have side effects up to 7?10 days after your injury. It is important to watch your condition for any changes. You may need to be watched in the emergency department or urgent care, or you may need to stay in the hospital.      What are the causes?    There are many possible causes of a head injury. A serious head injury may be caused by:   A car accident.     Bicycle or motorcycle accidents.     Sports injuries.     Falls.     Being hit by an object.        What are the signs or symptoms?    Symptoms of a head injury include a bruise, bump, or bleeding where the  injury happened. Other physical symptoms may include:   Headache.     Feeling like you may vomit (nauseous) or vomiting.     Dizziness.     Blurred or double vision.     Being uncomfortable around bright lights or loud noises.     Shaking movements that you cannot control (seizures).     Feeling tired.     Trouble being woken up.     Fainting or loss of consciousness.      Mental or emotional symptoms may include:   Feeling grumpy or cranky.     Confusion and memory problems.     Having trouble paying attention or concentrating.     Changes in eating or sleeping habits.     Feeling worried or nervous (anxious).     Feeling sad (depressed).        How is this treated?    Treatment for this condition depends on how severe the injury is and the type of injury you have. The main goal is to prevent problems and to allow the brain time to heal.    Mild head injury    If you have a mild head injury, you may be sent home, and treatment may include:   Being watched. A responsible adult should stay with you for 24 hours after your injury and check on you often.     Physical rest.     Brain rest.     Pain medicines.        Severe head injury    If you have a severe head injury, treatment may include:   Being watched closely. This includes staying in the hospital.     Medicines to:  ? Help with pain.    ? Prevent seizures.    ? Help with brain swelling.       Protecting your airway and using a machine that helps you breathe (ventilator).     Treatments to watch for and manage swelling inside the brain.     Brain surgery. This may be needed to:  ? Remove a collection of blood or  blood clots.    ? Stop the bleeding.    ? Remove a part of the skull. This allows room for the brain to swell.            Follow these instructions at home:    Activity     Rest.     Avoid activities that are hard or tiring.     Make sure you get enough sleep.     Let your brain rest. Do this by limiting activities that need a lot of thought or  attention, such as:  ? Watching TV.    ? Playing memory games and puzzles.    ? Job-related work or homework.    ? Working on [employer], and texting.       Avoid activities that could cause another head injury until your doctor says it is okay. This includes playing sports. Having another head injury, especially before the first one has healed, can be dangerous.     Ask your doctor when it is safe for you to go back to your normal activities, such as work or school. Ask your doctor for a step-by-step plan for slowly going back to your normal activities.     Ask your doctor when you can drive, ride a bicycle, or use heavy machinery. Do not do these activities if you are dizzy.      Lifestyle       Do not drink alcohol until your doctor says it is okay.     Do not use drugs.     If it is harder than usual to remember things, write them down.     If you are easily distracted, try to do one thing at a time.     Talk with family members or close friends when making important decisions.     Tell your friends, family, a trusted co-worker, and work Production designer, theatre/television/film about your injury, symptoms, and limits (restrictions). Have them watch for any problems that are new or getting worse.      General instructions     Take over-the-counter and prescription medicines only as told by your doctor.     Have someone stay with you for 24 hours after your head injury. This person should watch you for any changes in your symptoms and be ready to get help.     Keep all follow-up visits as told by your doctor. This is important.      How is this prevented?     Work on Therapist, music. This can help you avoid falls.     Wear a seat belt when you are in a moving vehicle.     Wear a helmet when you:  ? Ride a bicycle.    ? Ski.    ? Do any other sport or activity that has a risk of injury.       If you drink alcohol:  ? Limit how much you use to:  ? 0?1 drink a day for nonpregnant women.    ? 0?2 drinks a day for men.       ? Be aware of how much alcohol is in your drink. In the [place] one drink equals one 12 oz bottle of beer (355 mL), one 5 oz glass of wine (148 mL), or one 1? oz glass of hard liquor (44 mL).       Make your home safer by:  ? Getting rid of clutter from the floors  and stairs. This includes things that can make you trip.    ? Using grab bars in bathrooms and handrails by stairs.    ? Placing non-slip mats on floors and in bathtubs.    ? Putting more light in dim areas.          Where to find more information     Centers for Disease Control and Prevention: FootballExhibition.com.br      Get help right away if:     You have:  ? A very bad headache that is not helped by medicine.    ? Trouble walking or weakness in your arms and legs.    ? Clear or bloody fluid coming from your nose or ears.    ? Changes in how you see (vision).    ? A seizure.    ? More confusion or more grumpy moods.       Your symptoms get worse.     You are sleepier than normal and have trouble staying awake.     You lose your balance.     The black centers of your eyes (pupils) change in size.     Your speech is slurred.     Your dizziness gets worse.     You vomit.    These symptoms may be an emergency. Do not wait to see if the symptoms will go away. Get medical help right away. Call your local emergency services (911 in the [place] Do not drive yourself to the hospital.      Summary     Head injuries can be as minor as a small bump. Some head injuries can be worse.     Treatment for this condition depends on how severe the injury is and the type of injury you have.     Have someone stay with you for 24 hours after your head injury.     Ask your doctor when it is safe for you to go back to your normal activities, such as work or school.     To prevent a head injury, wear a seat belt in a car, wear a helmet when you use a bicycle, limit your alcohol use, and make your home safer.      This information is not intended to replace advice given to you by your health  care provider. Make sure you discuss any questions you have with your health care provider.      Document Revised: 06/07/2019 Document Reviewed: 06/07/2019  Elsevier Patient Education ? 2021 Elsevier Inc.      ---------------------------------------------------------------------------------------------------------------------  [facility]) encourages you to self-enroll in the [facility] Patient Portal.  [facility] Patient Portal will allow you to manage your personal health information securely from your own electronic device now and in the future.  To begin your Patient Portal enrollment process, please visit [email or web address] Click on "Sign up now" under [facility].  If you find that you need additional assistance on the [facility] Patient Portal or need a copy of your medical records, please call the [facility] Medical Records Office at [phone].  Comment:

## 2020-09-24 NOTE — ED Provider Notes (Signed)
Closed head injury without LOC        Patient:   Rhonda Carroll, Rhonda Carroll            MRN: [id number]            FIN: [id number]               Age:   55 years     Sex:  Female     DOB:  07/28/1966   Associated Diagnoses:   Closed head injury   Author:   Sonda Rumble II-MD      Basic Information   Additional information: Chief Complaint from Nursing Triage Note   Chief Complaint  Chief Complaint: Slipped and fell last night and hit head on concrete stairs - c/o nausea and not feeling well today. Had Tylenol and Phenergan PTA. Denies LOC and does not take blood thinner. (09/24/20 13:17:00).      History of Present Illness   55 year old white female presents emergency room for evaluation of headache.  Patient states that yesterday she was walking down some stairs when she slipped and hit the back of her head.  Patient states that she was not knocked unconscious and she does not take any blood thinners however she does have some mild nausea.  Patient states that last night she woke up several times with nausea and dizziness and headaches that she came to the emergency room for further evaluation.  Patient denies any other injuries from her fall.      Review of Systems   Neurologic symptoms:  Headache.             Additional review of systems information: All other systems reviewed and otherwise negative.      Health Status   Allergies:    Allergic Reactions (Selected)  No Known Allergies.   Medications:  (Selected)   Inpatient Medications  Ordered  Zofran ODT: 4 mg, 1 tabs, Oral, Once  Documented Medications  Documented  imipramine 50 mg oral tablet: TAKE 1 TABLET BY MOUTH DAILY  rizatriptan 10 mg oral tablet: TAKE 1 TABLET BY MOUTH AS NEEDED FOR MIGRAINE.      Past Medical/ Family/ Social History   Medical history: Reviewed as documented in chart.   Surgical history: Reviewed as documented in chart.   Family history: Not significant.   Social history: Reviewed as documented in chart.   Problem list:    No qualifying data  available  .      Physical Examination               Vital Signs   Vital Signs   09/24/2020 13:17 EST Systolic Blood Pressure 184 mmHg  >HHI    Diastolic Blood Pressure 98 mmHg  HI    Temperature Oral 36.5 degC    Heart Rate Monitored 85 bpm    Respiratory Rate 18 br/min    SpO2 100 %   .   Measurements   09/24/2020 13:21 EST Body Mass Index est meas 26.01 kg/m2    Body Mass Index Measured 26.01 kg/m2   09/24/2020 13:17 EST Height/Length Measured 165 cm    Weight Dosing 70.81 kg   .   Basic Oxygen Information   09/24/2020 13:17 EST Oxygen Therapy Room air    SpO2 100 %   .   General:  Alert, no acute distress.    Skin:  Warm, dry, pink.    Head:  Normocephalic, Minor contusion to the right posterior occiput.Marland Kitchen  Neck:  Supple, trachea midline.    Eye:  Pupils are equal, round and reactive to light, extraocular movements are intact.    Ears, nose, mouth and throat:  Oral mucosa moist.   Cardiovascular:  Regular rate and rhythm.   Respiratory:  Lungs are clear to auscultation, respirations are non-labored.    Back:  Normal range of motion.   Musculoskeletal:  Normal ROM, no tenderness.    Chest wall   Gastrointestinal:  Soft, Nontender, Non distended, Normal bowel sounds, No organomegaly.    Neurological:  Alert and oriented to person, place, time, and situation, No focal neurological deficit observed, normal sensory observed, normal motor observed.    Psychiatric:  Cooperative, appropriate mood & affect.       Medical Decision Making   Patient's CT scan of the brain was read as negative.  Patient will given Toradol and Compazine in the emergency room for headache.  I recommend she follow-up with her primary care physician or return to the emergency room she develops any new or worsening symptoms.      Reexamination/ Reevaluation   Vital signs   Basic Oxygen Information   09/24/2020 13:17 EST Oxygen Therapy Room air    SpO2 100 %         Impression and Plan   Diagnosis   Closed head injury (ICD10-CM S09.90XA, Discharge,  Medical)   Plan   Condition: Improved.    Disposition: Discharged: to home.    Patient was given the following educational materials: Head Injury, Adult, Easy-to-Read.    Follow up with: Follow up with primary care provider Within 1 week Return to ED if symptoms worsen.    Counseled: Patient, Regarding diagnosis, Regarding diagnostic results, Regarding treatment plan, Patient indicated understanding of instructions.    Signature Line     Electronically Signed on 09/24/2020 03:13 PM EST   ________________________________________________   Sonda Rumble II-MD               Modified by: Sonda Rumble II-MD on 09/24/2020 03:13 PM EST

## 2020-09-24 NOTE — ED Notes (Signed)
ED Triage Note       ED Triage Adult Entered On:  09/24/2020 13:21 EST    Performed On:  09/24/2020 13:17 EST by Julien Girt, RN, Darcy A               Triage   Numeric Rating Pain Scale :   9   Chief Complaint :   Slipped and fell last night and hit head on concrete stairs - c/o nausea and not feeling well today. Had Tylenol and Phenergan PTA. Denies LOC and does not take blood thinner.   [place] Mode of Arrival :   Private vehicle   Infectious Disease Documentation :   Document assessment   Temperature Oral :   36.5 degC(Converted to: 97.7 degF)    Heart Rate Monitored :   85 bpm   Respiratory Rate :   18 br/min   Systolic Blood Pressure :   184 mmHg (>HHI)    Diastolic Blood Pressure :   98 mmHg (HI)    SpO2 :   100 %   Oxygen Therapy :   Room air   Patient presentation :   None of the above   Chief Complaint or Presentation suggest infection :   No   Weight Dosing :   70.81 kg(Converted to: 156 lb 2 oz)    Height :   165 cm(Converted to: 5 ft 5 in)    Body Mass Index Dosing :   26 kg/m2   Julien Girt, RN, Jewel Baize A - 09/24/2020 13:17 EST   DCP GENERIC CODE   Tracking Acuity :   4   Tracking Group :   ED [employer]   Bonham, RN, Programme researcher, broadcasting/film/video A - 09/24/2020 13:17 EST   ED General Section :   Document assessment   Pregnancy Status :   Patient denies   ED Allergies Section :   Document assessment   ED Reason for Visit Section :   Document assessment   ED Home Meds Section :   Document assessment   ED Quick Assessment :   Patient appears awake, alert, oriented to baseline. Skin warm and dry. Moves all extremities. Respiration even and unlabored. Appears in no apparent distress.   Julien Girt, RN, Programme researcher, broadcasting/film/video A - 09/24/2020 13:17 EST   ID Risk Screen Symptoms   Recent Travel History :   No recent travel   Close Contact with COVID-19 ID :   No   Last 14 days COVID-19 ID :   No   TB Symptom Screen :   No symptoms   C. diff Symptom/History ID :   Neither of the above   Patient Pregnant :   None of the above   Julien Girt, RN, Jewel Baize A -  09/24/2020 13:17 EST   Allergies   (As Of: 09/24/2020 13:21:33 EST)   Allergies (Active)   No Known Allergies  Estimated Onset Date:   Unspecified ; Created ByJulien Girt, RN, Darcy A; Reaction Status:   Active ; Category:   Drug ; Substance:   No Known Allergies ; Type:   Allergy ; Updated By:   Julien Girt, RN, Clyde Canterbury; Reviewed Date:   09/24/2020 13:18 EST        Psycho-Social   Last 3 mo, thoughts killing self/others :   Patient denies   Right click within box for Suspected Abuse policy link. :   None   Feels Safe Where Live :   Yes   ED Behavioral Activity Rating  Scale :   4 - Quiet and awake (normal level of activity)   Julien Girt, RN, Clyde Canterbury - 09/24/2020 13:17 EST   ED Home Med List   Medication List   (As Of: 09/24/2020 13:21:33 EST)   Home Meds    imipramine  :   imipramine ; Status:   Documented ; Ordered As Mnemonic:   imipramine 50 mg oral tablet ; Simple Display Line:   TAKE 1 TABLET BY MOUTH DAILY ; Catalog Code:   imipramine ; Order Dt/Tm:   09/24/2020 13:21:24 EST          rizatriptan  :   rizatriptan ; Status:   Documented ; Ordered As Mnemonic:   rizatriptan 10 mg oral tablet ; Simple Display Line:   TAKE 1 TABLET BY MOUTH AS NEEDED FOR MIGRAINE ; Catalog Code:   rizatriptan ; Order Dt/Tm:   09/24/2020 13:21:24 EST            ED Reason for Visit   (As Of: 09/24/2020 13:21:33 EST)   Diagnoses(Active)    Closed head injury without LOC  Date:   09/24/2020 ; Diagnosis Type:   Reason For Visit ; Confirmation:   Complaint of ; Clinical Dx:   Closed head injury without LOC ; Classification:   Medical ; Clinical Service:   Emergency medicine ; Code:   PNED ; Probability:   0 ; Diagnosis Code:   [id number]

## 2020-09-24 NOTE — ED Notes (Signed)
ED Patient Education Note     Patient Education Materials Follows:  Neurology     Head Injury, Adult      There are many types of head injuries. They can be as minor as a small bump. Some head injuries can be worse. Worse injuries include:   A strong hit to the head that shakes the brain back and forth, causing damage (concussion).     A bruise (contusion) of the brain. This means there is bleeding in the brain that can cause swelling.     A cracked skull (skull fracture).     Bleeding in the brain that gathers, gets thick (makes a clot), and forms a bump (hematoma).      Most problems from a head injury come in the first 24 hours. However, you may still have side effects up to 7?10 days after your injury. It is important to watch your condition for any changes. You may need to be watched in the emergency department or urgent care, or you may need to stay in the hospital.      What are the causes?    There are many possible causes of a head injury. A serious head injury may be caused by:   A car accident.     Bicycle or motorcycle accidents.     Sports injuries.     Falls.     Being hit by an object.        What are the signs or symptoms?    Symptoms of a head injury include a bruise, bump, or bleeding where the injury happened. Other physical symptoms may include:   Headache.     Feeling like you may vomit (nauseous) or vomiting.     Dizziness.     Blurred or double vision.     Being uncomfortable around bright lights or loud noises.     Shaking movements that you cannot control (seizures).     Feeling tired.     Trouble being woken up.     Fainting or loss of consciousness.      Mental or emotional symptoms may include:   Feeling grumpy or cranky.     Confusion and memory problems.     Having trouble paying attention or concentrating.     Changes in eating or sleeping habits.     Feeling worried or nervous (anxious).     Feeling sad (depressed).        How is this treated?    Treatment for this condition depends  on how severe the injury is and the type of injury you have. The main goal is to prevent problems and to allow the brain time to heal.    Mild head injury    If you have a mild head injury, you may be sent home, and treatment may include:   Being watched. A responsible adult should stay with you for 24 hours after your injury and check on you often.     Physical rest.     Brain rest.     Pain medicines.        Severe head injury    If you have a severe head injury, treatment may include:   Being watched closely. This includes staying in the hospital.     Medicines to:  ? Help with pain.    ? Prevent seizures.    ? Help with brain swelling.       Protecting your airway and using a   machine that helps you breathe (ventilator).     Treatments to watch for and manage swelling inside the brain.     Brain surgery. This may be needed to:  ? Remove a collection of blood or blood clots.    ? Stop the bleeding.    ? Remove a part of the skull. This allows room for the brain to swell.            Follow these instructions at home:    Activity     Rest.     Avoid activities that are hard or tiring.     Make sure you get enough sleep.     Let your brain rest. Do this by limiting activities that need a lot of thought or attention, such as:  ? Watching TV.    ? Playing memory games and puzzles.    ? Job-related work or homework.    ? Working on the computer, social media, and texting.       Avoid activities that could cause another head injury until your doctor says it is okay. This includes playing sports. Having another head injury, especially before the first one has healed, can be dangerous.     Ask your doctor when it is safe for you to go back to your normal activities, such as work or school. Ask your doctor for a step-by-step plan for slowly going back to your normal activities.     Ask your doctor when you can drive, ride a bicycle, or use heavy machinery. Do not do these activities if you are dizzy.      Lifestyle       Do  not drink alcohol until your doctor says it is okay.     Do not use drugs.     If it is harder than usual to remember things, write them down.     If you are easily distracted, try to do one thing at a time.     Talk with family members or close friends when making important decisions.     Tell your friends, family, a trusted co-worker, and work manager about your injury, symptoms, and limits (restrictions). Have them watch for any problems that are new or getting worse.      General instructions     Take over-the-counter and prescription medicines only as told by your doctor.     Have someone stay with you for 24 hours after your head injury. This person should watch you for any changes in your symptoms and be ready to get help.     Keep all follow-up visits as told by your doctor. This is important.      How is this prevented?     Work on your balance and strength. This can help you avoid falls.     Wear a seat belt when you are in a moving vehicle.     Wear a helmet when you:  ? Ride a bicycle.    ? Ski.    ? Do any other sport or activity that has a risk of injury.       If you drink alcohol:  ? Limit how much you use to:  ? 0?1 drink a day for nonpregnant women.    ? 0?2 drinks a day for men.      ? Be aware of how much alcohol is in your drink. In the [place] one drink equals one 12 oz bottle of beer (355 mL),   one 5 oz glass of wine (148 mL), or one 1? oz glass of hard liquor (44 mL).       Make your home safer by:  ? Getting rid of clutter from the floors and stairs. This includes things that can make you trip.    ? Using grab bars in bathrooms and handrails by stairs.    ? Placing non-slip mats on floors and in bathtubs.    ? Putting more light in dim areas.          Where to find more information     Centers for Disease Control and Prevention: www.cdc.gov      Get help right away if:     You have:  ? A very bad headache that is not helped by medicine.    ? Trouble walking or weakness in your arms and legs.     ? Clear or bloody fluid coming from your nose or ears.    ? Changes in how you see (vision).    ? A seizure.    ? More confusion or more grumpy moods.       Your symptoms get worse.     You are sleepier than normal and have trouble staying awake.     You lose your balance.     The black centers of your eyes (pupils) change in size.     Your speech is slurred.     Your dizziness gets worse.     You vomit.    These symptoms may be an emergency. Do not wait to see if the symptoms will go away. Get medical help right away. Call your local emergency services (911 in the [place] Do not drive yourself to the hospital.      Summary     Head injuries can be as minor as a small bump. Some head injuries can be worse.     Treatment for this condition depends on how severe the injury is and the type of injury you have.     Have someone stay with you for 24 hours after your head injury.     Ask your doctor when it is safe for you to go back to your normal activities, such as work or school.     To prevent a head injury, wear a seat belt in a car, wear a helmet when you use a bicycle, limit your alcohol use, and make your home safer.      This information is not intended to replace advice given to you by your health care provider. Make sure you discuss any questions you have with your health care provider.      Document Revised: 06/07/2019 Document Reviewed: 06/07/2019  Elsevier Patient Education ? 2021 Elsevier Inc.

## 2020-09-24 NOTE — Discharge Summary (Signed)
ED Clinical Summary                        [facility]  [address]  [place], [place], [address]  [phone]           PERSON INFORMATION  Name: Rhonda Carroll, GIESE Age:  55 Years DOB: 1966-02-24   Sex: Female Language: English PCP: PCP,  NONE   Marital Status: Married Phone: [phone] Med Service: MED-Medicine   MRN: [id number] Acct# [id number] Arrival: 09/24/2020 13:11:00   Visit Reason: Closed head injury without LOC; FALL/HIT HEAD Acuity: 4 LOS: 000 03:06   Address:    [address] [place] [address]   Diagnosis:    Closed head injury  Medications:          Medications that have not changed  Other Medications  imipramine (imipramine 50 mg oral tablet) TAKE 1 TABLET BY MOUTH DAILY.  Last Dose:____________________  rizatriptan (rizatriptan 10 mg oral tablet) TAKE 1 TABLET BY MOUTH AS NEEDED FOR MIGRAINE.  Last Dose:____________________      Medications Administered During Visit:                Medication Dose Route   ondansetron 4 mg Oral   ketorolac 30 mg IM   prochlorperazine 10 mg IM               Allergies      No Known Allergies      Major Tests and Procedures:  The following procedures and tests were performed during your ED visit.  COMMON PROCEDURES%>  COMMON PROCEDURES COMMENTS%>                PROVIDER INFORMATION               Provider Role Assigned Nolon Nations, RN, Clyde Canterbury ED Nurse 09/24/2020 13:23:48    Sonda Rumble II-MD ED Provider 09/24/2020 13:27:07        Attending Physician:  Sonda Rumble II-MD      Admit Doc  Sonda Rumble II-MD     Consulting Doc       VITALS INFORMATION  Vital Sign Triage Latest   Temp Oral ORAL_1%> ORAL%>   Temp Temporal TEMPORAL_1%> TEMPORAL%>   Temp Intravascular INTRAVASCULAR_1%> INTRAVASCULAR%>   Temp Axillary AXILLARY_1%> AXILLARY%>   Temp Rectal RECTAL_1%> RECTAL%>   02 Sat 100 % 100 %   Respiratory Rate RATE_1%> RATE%>   Peripheral Pulse Rate PULSE RATE_1%> PULSE RATE%>   Apical Heart Rate HEART RATE_1%> HEART RATE%>   Blood  Pressure BLOOD PRESSURE_1%>/ BLOOD PRESSURE_1%>98 mmHg BLOOD PRESSURE%> / BLOOD PRESSURE%>92 mmHg                 Immunizations      No Immunizations Documented This Visit          DISCHARGE INFORMATION   Discharge Disposition: H Outpt-Sent Home   Discharge Location:  Home   Discharge Date and Time:  09/24/2020 16:17:46   ED Checkout Date and Time:  09/24/2020 16:17:46     DEPART REASON INCOMPLETE INFORMATION               Depart Action Incomplete Reason   Interactive View/I&O Recently assessed               Problems      No Problems Documented              Smoking Status  No Smoking Status Documented         PATIENT EDUCATION INFORMATION  Instructions:     Head Injury, Adult, Easy-to-Read     Follow up:                   With: Address: When:   Follow up with primary care provider  Within 1 week   Comments:   Return to ED if symptoms worsen              ED PROVIDER DOCUMENTATION     Patient:   Rhonda Carroll            MRN: [id number]            FIN: [id number]               Age:   25 years     Sex:  Female     DOB:  1966/05/31   Associated Diagnoses:   Closed head injury   Author:   Sonda Rumble II-MD      Basic Information   Additional information: Chief Complaint from Nursing Triage Note   Chief Complaint  Chief Complaint: Slipped and fell last night and hit head on concrete stairs - c/o nausea and not feeling well today. Had Tylenol and Phenergan PTA. Denies LOC and does not take blood thinner. (09/24/20 13:17:00).      History of Present Illness   55 year old white female presents emergency room for evaluation of headache.  Patient states that yesterday she was walking down some stairs when she slipped and hit the back of her head.  Patient states that she was not knocked unconscious and she does not take any blood thinners however she does have some mild nausea.  Patient states that last night she woke up several times with nausea and dizziness and headaches that she came to the emergency room for further  evaluation.  Patient denies any other injuries from her fall.      Review of Systems   Neurologic symptoms:  Headache.             Additional review of systems information: All other systems reviewed and otherwise negative.      Health Status   Allergies:    Allergic Reactions (Selected)  No Known Allergies.   Medications:  (Selected)   Inpatient Medications  Ordered  Zofran ODT: 4 mg, 1 tabs, Oral, Once  Documented Medications  Documented  imipramine 50 mg oral tablet: TAKE 1 TABLET BY MOUTH DAILY  rizatriptan 10 mg oral tablet: TAKE 1 TABLET BY MOUTH AS NEEDED FOR MIGRAINE.      Past Medical/ Family/ Social History   Medical history: Reviewed as documented in chart.   Surgical history: Reviewed as documented in chart.   Family history: Not significant.   Social history: Reviewed as documented in chart.   Problem list:    No qualifying data available  .      Physical Examination               Vital Signs   Vital Signs   09/24/2020 13:17 EST Systolic Blood Pressure 184 mmHg  >HHI    Diastolic Blood Pressure 98 mmHg  HI    Temperature Oral 36.5 degC    Heart Rate Monitored 85 bpm    Respiratory Rate 18 br/min    SpO2 100 %   .   Measurements   09/24/2020 13:21 EST Body Mass Index est meas 26.01 kg/m2  Body Mass Index Measured 26.01 kg/m2   09/24/2020 13:17 EST Height/Length Measured 165 cm    Weight Dosing 70.81 kg   .   Basic Oxygen Information   09/24/2020 13:17 EST Oxygen Therapy Room air    SpO2 100 %   .   General:  Alert, no acute distress.    Skin:  Warm, dry, pink.    Head:  Normocephalic, Minor contusion to the right posterior occiput..    Neck:  Supple, trachea midline.    Eye:  Pupils are equal, round and reactive to light, extraocular movements are intact.    Ears, nose, mouth and throat:  Oral mucosa moist.   Cardiovascular:  Regular rate and rhythm.   Respiratory:  Lungs are clear to auscultation, respirations are non-labored.    Back:  Normal range of motion.   Musculoskeletal:  Normal ROM, no  tenderness.    Chest wall   Gastrointestinal:  Soft, Nontender, Non distended, Normal bowel sounds, No organomegaly.    Neurological:  Alert and oriented to person, place, time, and situation, No focal neurological deficit observed, normal sensory observed, normal motor observed.    Psychiatric:  Cooperative, appropriate mood & affect.       Medical Decision Making   Patient's CT scan of the brain was read as negative.  Patient will given Toradol and Compazine in the emergency room for headache.  I recommend she follow-up with her primary care physician or return to the emergency room she develops any new or worsening symptoms.      Reexamination/ Reevaluation   Vital signs   Basic Oxygen Information   09/24/2020 13:17 EST Oxygen Therapy Room air    SpO2 100 %         Impression and Plan   Diagnosis   Closed head injury (ICD10-CM S09.90XA, Discharge, Medical)   Plan   Condition: Improved.    Disposition: Discharged: to home.    Patient was given the following educational materials: Head Injury, Adult, Easy-to-Read.    Follow up with: Follow up with primary care provider Within 1 week Return to ED if symptoms worsen.    Counseled: Patient, Regarding diagnosis, Regarding diagnostic results, Regarding treatment plan, Patient indicated understanding of instructions.

## 2020-09-24 NOTE — Nursing Note (Signed)
Bedside Nursing Swallow Screen Form       Bedside Nursing Swallow Screen Entered On:  09/24/2020 13:53 EST    Performed On:  09/24/2020 13:53 EST by Julien Girt, RN, Darcy A               Bedside Nursing Swallow Screen   DCP GENERIC CODE   Unable to Remain Alert for Testing :   No   Eating Modified Diet :   No   Existing Enteral Tube Feeding :   No   Head of Bed Restrictions < 30 Degrees :   No   Tracheostomy Tube Present :   No   NPO by Provider Order :   No   Julien Girt, RN, Jewel Baize A - 09/24/2020 13:53 EST   DCP GENERIC CODE   Oriented to Person, Place, and Time :   Yes   Open Mouth, Stick out Tongue, Smile :   Yes   Appropriate Tongue Range of Motion :   Yes   Facial Symmetry :   Yes   Julien Girt RNJewel Baize A - 09/24/2020 13:53 EST   Bedside Nursing Swallow Screen Result :   Tora Perches, RN, Jewel Baize A - 09/24/2020 13:53 EST

## 2020-10-21 ENCOUNTER — Other Ambulatory Visit: Payer: Self-pay | Admitting: Orthopedic Surgery

## 2020-10-21 DIAGNOSIS — S43431A Superior glenoid labrum lesion of right shoulder, initial encounter: Secondary | ICD-10-CM

## 2020-11-11 ENCOUNTER — Other Ambulatory Visit: Payer: Self-pay

## 2020-11-11 ENCOUNTER — Ambulatory Visit
Admission: RE | Admit: 2020-11-11 | Discharge: 2020-11-11 | Disposition: A | Discharge status: Home / Self Care | Payer: BC Managed Care – PPO | Source: Ambulatory Visit | Attending: Orthopedic Surgery | Admitting: Orthopedic Surgery

## 2020-11-11 DIAGNOSIS — S43431A Superior glenoid labrum lesion of right shoulder, initial encounter: Secondary | ICD-10-CM

## 2020-11-11 MED ORDER — IOPAMIDOL (ISOVUE-M 200) INJECTION 41%
13.0000 mL | Freq: Once | INTRAMUSCULAR | Status: AC
  Administered 2020-11-11: 13 mL via INTRA_ARTICULAR

## 2020-11-18 ENCOUNTER — Other Ambulatory Visit: Payer: BC Managed Care – PPO

## 2021-06-02 ENCOUNTER — Telehealth: Payer: Self-pay | Admitting: Nurse Practitioner

## 2021-06-03 ENCOUNTER — Other Ambulatory Visit: Payer: Self-pay

## 2021-06-03 ENCOUNTER — Encounter: Payer: Self-pay | Admitting: Nurse Practitioner

## 2021-06-03 ENCOUNTER — Ambulatory Visit: Payer: BC Managed Care – PPO | Admitting: Nurse Practitioner

## 2021-06-03 VITALS — BP 174/92 | HR 66 | Temp 96.4°F | Wt 151.6 lb

## 2021-06-03 DIAGNOSIS — R8290 Unspecified abnormal findings in urine: Secondary | ICD-10-CM

## 2021-06-03 DIAGNOSIS — Z780 Asymptomatic menopausal state: Secondary | ICD-10-CM | POA: Insufficient documentation

## 2021-06-03 DIAGNOSIS — E559 Vitamin D deficiency, unspecified: Secondary | ICD-10-CM | POA: Insufficient documentation

## 2021-06-03 DIAGNOSIS — R030 Elevated blood-pressure reading, without diagnosis of hypertension: Secondary | ICD-10-CM

## 2021-06-03 DIAGNOSIS — E785 Hyperlipidemia, unspecified: Secondary | ICD-10-CM | POA: Insufficient documentation

## 2021-06-03 LAB — BASIC METABOLIC PANEL
BUN: 14 mg/dL (ref 6–23)
CO2: 28 mEq/L (ref 19–32)
Calcium: 9.4 mg/dL (ref 8.4–10.5)
Chloride: 102 mEq/L (ref 96–112)
Creatinine, Ser: 0.87 mg/dL (ref 0.40–1.20)
GFR: 75.11 mL/min (ref >60.00)
Glucose, Bld: 89 mg/dL (ref 70–99)
Potassium: 4.4 mEq/L (ref 3.5–5.1)
Sodium: 137 mEq/L (ref 135–145)

## 2021-06-03 LAB — URINALYSIS, ROUTINE W REFLEX MICROSCOPIC
Bilirubin Urine: NEGATIVE
Ketones, ur: NEGATIVE
Nitrite: NEGATIVE
Specific Gravity, Urine: <=1.005 — AB (ref 1.000–1.030)
Total Protein, Urine: NEGATIVE
Urine Glucose: NEGATIVE
Urobilinogen, UA: 0.2 (ref 0.0–1.0)
pH: 7 (ref 5.0–8.0)

## 2021-06-03 LAB — TSH: TSH: 1.04 u[IU]/mL (ref 0.35–5.50)

## 2021-06-03 MED ORDER — AMLODIPINE BESYLATE 5 MG PO TABS: 5.0000 mg | ORAL_TABLET | Freq: Every day | ORAL | 5 refills | Status: DC

## 2021-06-03 NOTE — Patient Instructions (Addendum)
ECG: normal Go to lab for blood draw and urine collection Start amlodipine 5mg  at bedtime Maintain DASH diet Monitor BP every AM and records F/up in office in 2weeks  DASH Eating Plan DASH stands for Dietary Approaches to Stop Hypertension. The DASH eating plan is a healthy eating plan that has been shown to: Reduce high blood pressure (hypertension). Reduce your risk for type 2 diabetes, heart disease, and stroke. Help with weight loss. What are tips for following this plan? Reading food labels Check food labels for the amount of salt (sodium) per serving. Choose foods with less than 5 percent of the Daily Value of sodium. Generally, foods with less than 300 milligrams (mg) of sodium per serving fit into this eating plan. To find whole grains, look for the word "whole" as the first word in the ingredient list. Shopping Buy products labeled as "low-sodium" or "no salt added." Buy fresh foods. Avoid canned foods and pre-made or frozen meals. Cooking Avoid adding salt when cooking. Use salt-free seasonings or herbs instead of table salt or sea salt. Check with your health care provider or pharmacist before using salt substitutes. Do not fry foods. Cook foods using healthy methods such as baking, boiling, grilling, roasting, and broiling instead. Cook with heart-healthy oils, such as olive, canola, avocado, soybean, or sunflower oil. Meal planning  Eat a balanced diet that includes: 4 or more servings of fruits and 4 or more servings of vegetables each day. Try to fill one-half of your plate with fruits and vegetables. 6-8 servings of whole grains each day. Less than 6 oz (170 g) of lean meat, poultry, or fish each day. A 3-oz (85-g) serving of meat is about the same size as a deck of cards. One egg equals 1 oz (28 g). 2-3 servings of low-fat dairy each day. One serving is 1 cup (237 mL). 1 serving of nuts, seeds, or beans 5 times each week. 2-3 servings of heart-healthy fats. Healthy  fats called omega-3 fatty acids are found in foods such as walnuts, flaxseeds, fortified milks, and eggs. These fats are also found in cold-water fish, such as sardines, salmon, and mackerel. Limit how much you eat of: Canned or prepackaged foods. Food that is high in trans fat, such as some fried foods. Food that is high in saturated fat, such as fatty meat. Desserts and other sweets, sugary drinks, and other foods with added sugar. Full-fat dairy products. Do not salt foods before eating. Do not eat more than 4 egg yolks a week. Try to eat at least 2 vegetarian meals a week. Eat more home-cooked food and less restaurant, buffet, and fast food. Lifestyle When eating at a restaurant, ask that your food be prepared with less salt or no salt, if possible. If you drink alcohol: Limit how much you use to: 0-1 drink a day for women who are not pregnant. 0-2 drinks a day for men. Be aware of how much alcohol is in your drink. In the [place] one drink equals one 12 oz bottle of beer (355 mL), one 5 oz glass of wine (148 mL), or one 1 oz glass of hard liquor (44 mL). General information Avoid eating more than 2,300 mg of salt a day. If you have hypertension, you may need to reduce your sodium intake to 1,500 mg a day. Work with your health care provider to maintain a healthy body weight or to lose weight. Ask what an ideal weight is for you. Get at least 30 minutes of  exercise that causes your heart to beat faster (aerobic exercise) most days of the week. Activities may include walking, swimming, or biking. Work with your health care provider or dietitian to adjust your eating plan to your individual calorie needs. What foods should I eat? Fruits All fresh, dried, or frozen fruit. Canned fruit in natural juice (without added sugar). Vegetables Fresh or frozen vegetables (raw, steamed, roasted, or grilled). Low-sodium or reduced-sodium tomato and vegetable juice. Low-sodium or reduced-sodium tomato  sauce and tomato paste. Low-sodium or reduced-sodium canned vegetables. Grains Whole-grain or whole-wheat bread. Whole-grain or whole-wheat pasta. Brown rice. [place]. Bulgur. Whole-grain and low-sodium cereals. Pita bread. Low-fat, low-sodium crackers. Whole-wheat flour tortillas. Meats and other proteins Skinless chicken or [place]. Ground chicken or [place]. Pork with fat trimmed off. Fish and seafood. Egg whites. Dried beans, peas, or lentils. Unsalted nuts, nut butters, and seeds. Unsalted canned beans. Lean cuts of beef with fat trimmed off. Low-sodium, lean precooked or cured meat, such as sausages or meat loaves. Dairy Low-fat (1%) or fat-free (skim) milk. Reduced-fat, low-fat, or fat-free cheeses. Nonfat, low-sodium ricotta or cottage cheese. Low-fat or nonfat yogurt. Low-fat, low-sodium cheese. Fats and oils Soft margarine without trans fats. Vegetable oil. Reduced-fat, low-fat, or light mayonnaise and salad dressings (reduced-sodium). Canola, safflower, olive, avocado, soybean, and sunflower oils. Avocado. Seasonings and condiments Herbs. Spices. Seasoning mixes without salt. Other foods Unsalted popcorn and pretzels. Fat-free sweets. The items listed above may not be a complete list of foods and beverages you can eat. Contact a dietitian for more information. What foods should I avoid? Fruits Canned fruit in a light or heavy syrup. Fried fruit. Fruit in cream or butter sauce. Vegetables Creamed or fried vegetables. Vegetables in a cheese sauce. Regular canned vegetables (not low-sodium or reduced-sodium). Regular canned tomato sauce and paste (not low-sodium or reduced-sodium). Regular tomato and vegetable juice (not low-sodium or reduced-sodium). Angie Fava. Olives. Grains Baked goods made with fat, such as croissants, muffins, or some breads. Dry pasta or rice meal packs. Meats and other proteins Fatty cuts of meat. Ribs. Fried meat. Berniece Salines. Bologna, salami, and other precooked  or cured meats, such as sausages or meat loaves. Fat from the back of a pig (fatback). Bratwurst. Salted nuts and seeds. Canned beans with added salt. Canned or smoked fish. Whole eggs or egg yolks. Chicken or [place] with skin. Dairy Whole or 2% milk, cream, and half-and-half. Whole or full-fat cream cheese. Whole-fat or sweetened yogurt. Full-fat cheese. Nondairy creamers. Whipped toppings. Processed cheese and cheese spreads. Fats and oils Butter. Stick margarine. Lard. Shortening. Ghee. Bacon fat. Tropical oils, such as coconut, palm kernel, or palm oil. Seasonings and condiments Onion salt, garlic salt, seasoned salt, table salt, and sea salt. Worcestershire sauce. Tartar sauce. Barbecue sauce. Teriyaki sauce. Soy sauce, including reduced-sodium. Steak sauce. Canned and packaged gravies. Fish sauce. Oyster sauce. Cocktail sauce. Store-bought horseradish. Ketchup. Mustard. Meat flavorings and tenderizers. Bouillon cubes. Hot sauces. Pre-made or packaged marinades. Pre-made or packaged taco seasonings. Relishes. Regular salad dressings. Other foods Salted popcorn and pretzels. The items listed above may not be a complete list of foods and beverages you should avoid. Contact a dietitian for more information. Where to find more information National Heart, Lung, and Blood Institute: https://wilson-eaton.com/ American Heart Association: www.heart.org Academy of Nutrition and Dietetics: www.eatright.[place]: www.kidney.org Summary The DASH eating plan is a healthy eating plan that has been shown to reduce high blood pressure (hypertension). It may also reduce your risk for type 2  diabetes, heart disease, and stroke. When on the DASH eating plan, aim to eat more fresh fruits and vegetables, whole grains, lean proteins, low-fat dairy, and heart-healthy fats. With the DASH eating plan, you should limit salt (sodium) intake to 2,300 mg a day. If you have hypertension, you may need to reduce  your sodium intake to 1,500 mg a day. Work with your health care provider or dietitian to adjust your eating plan to your individual calorie needs. This information is not intended to replace advice given to you by your health care provider. Make sure you discuss any questions you have with your health care provider. Document Revised: 06/28/2019 Document Reviewed: 06/28/2019 Elsevier Patient Education  2022 [employer].

## 2021-06-03 NOTE — Progress Notes (Signed)
Subjective:  Patient ID: Rachael Scott, female    DOB: 1966-03-28  Age: 55 y.o. MRN: [id number]  CC: Hypertension (Elevated BP at home)   HPI Elevated BP without diagnosis of hypertension Elevated BP x 3weeks: 190/110, 180/105 (asymptomatic) Testosterone pellets first inserted 02/2021 and second insertion 04/2021. takes impramine  as needed. No tobacco use, occasional ETOH consumption, no illicit drug use. Maintains active lifestyle and heart healthy diet  Advised to stop hormone replacement therapy Start amlodipine Check ECG, cbc, tsh, urine, and bmp F/up in 2weeks   BP Readings from Last 3 Encounters:  06/03/21 (!) 174/92  03/11/20 138/82  03/04/20 (!) 188/109    Reviewed past Medical, Social and Family history today.  Outpatient Medications Prior to Visit  Medication Sig Dispense Refill   acetaminophen (TYLENOL) 325 MG tablet Take 650 mg by mouth every 6 (six) hours as needed for pain.     imipramine (TOFRANIL) 50 MG tablet Take 50 mg by mouth daily.     promethazine (PHENERGAN) 25 MG tablet Take 1 tablet (25 mg total) by mouth every 6 (six) hours as needed for nausea or vomiting. 30 tablet 0   amphetamine-dextroamphetamine (ADDERALL) 10 MG tablet Take by mouth.     gabapentin (NEURONTIN) 100 MG capsule Take 1 capsule (100 mg total) by mouth 2 (two) times daily. 60 capsule 2   QUDEXY XR CS24 sprinkle cap TK 3 CS PO QD (Patient not taking: Reported on 03/11/2020)  3   No facility-administered medications prior to visit.    ROS See HPI  Objective:  BP (!) 174/92 (BP Location: Left Arm, Patient Position: Supine, Cuff Size: Normal)   Pulse 66   Temp (!) 96.4 F (35.8 C) (Temporal)   Wt 151 lb 9.6 oz (68.8 kg)   LMP 02/19/2020   SpO2 98%   BMI 25.23 kg/m   ECG: Sinus Bradycardia, no change in intervals compared to 2017.  Physical Exam Vitals reviewed.  Cardiovascular:     Rate and Rhythm: Normal rate and regular rhythm.     Pulses: Normal pulses.     Heart  sounds: Normal heart sounds.  Pulmonary:     Effort: Pulmonary effort is normal.     Breath sounds: Normal breath sounds.  Musculoskeletal:     Right lower leg: No edema.     Left lower leg: No edema.  Neurological:     Mental Status: She is alert and oriented to person, place, and time.     Cranial Nerves: No cranial nerve deficit.  Psychiatric:        Mood and Affect: Mood normal.        Behavior: Behavior normal.        Thought Content: Thought content normal.    Assessment & Plan:  This visit occurred during the SARS-CoV-2 public health emergency.  Safety protocols were in place, including screening questions prior to the visit, additional usage of staff PPE, and extensive cleaning of exam room while observing appropriate contact time as indicated for disinfecting solutions.   Rachael Scott was seen today for hypertension.  Diagnoses and all orders for this visit:  Elevated BP without diagnosis of hypertension -     Basic metabolic panel -     TSH -     Cancel: CBC -     EKG 12-Lead -     Urinalysis, Routine w reflex microscopic -     amLODipine (NORVASC) 5 MG tablet; Take 1 tablet (5 mg total) by mouth at  bedtime. -     CBC  Abnormal urinalysis -     Urine Culture   Problem List Items Addressed This Visit       Other   Elevated BP without diagnosis of hypertension - Primary    Elevated BP x 3weeks: 190/110, 180/105 (asymptomatic) Testosterone pellets first inserted 02/2021 and second insertion 04/2021. takes impramine  as needed. No tobacco use, occasional ETOH consumption, no illicit drug use. Maintains active lifestyle and heart healthy diet  Advised to stop hormone replacement therapy Start amlodipine Check ECG, cbc, tsh, urine, and bmp F/up in 2weeks      Relevant Medications   amLODipine (NORVASC) 5 MG tablet   Other Relevant Orders   Basic metabolic panel (Completed)   TSH (Completed)   EKG 12-Lead (Completed)   Urinalysis, Routine w reflex microscopic  (Completed)   CBC   Other Visit Diagnoses     Abnormal urinalysis       Relevant Orders   Urine Culture       Follow-up: Return in about 2 [place] (around 06/17/2021) for Elevated BP.  Wilfred Lacy, NP

## 2021-06-04 ENCOUNTER — Encounter: Payer: Self-pay | Admitting: Nurse Practitioner

## 2021-06-04 DIAGNOSIS — R030 Elevated blood-pressure reading, without diagnosis of hypertension: Secondary | ICD-10-CM | POA: Insufficient documentation

## 2021-06-04 DIAGNOSIS — I10 Essential (primary) hypertension: Secondary | ICD-10-CM | POA: Insufficient documentation

## 2021-06-04 LAB — URINE CULTURE
MICRO NUMBER:: 12560229
SPECIMEN QUALITY:: ADEQUATE

## 2021-06-04 NOTE — Assessment & Plan Note (Addendum)
Elevated BP x 3weeks: 190/110, 180/105 (asymptomatic) Testosterone pellets first inserted 02/2021 and second insertion 04/2021. takes impramine  as needed. No tobacco use, occasional ETOH consumption, no illicit drug use. Maintains active lifestyle and heart healthy diet  Advised to stop hormone replacement therapy Start amlodipine Check ECG, cbc, tsh, urine, and bmp F/up in 2weeks

## 2021-06-17 ENCOUNTER — Ambulatory Visit: Payer: BC Managed Care – PPO | Admitting: Nurse Practitioner

## 2021-06-17 ENCOUNTER — Encounter: Payer: Self-pay | Admitting: Nurse Practitioner

## 2021-06-17 ENCOUNTER — Other Ambulatory Visit: Payer: Self-pay

## 2021-06-17 VITALS — BP 138/88 | HR 60 | Temp 96.9°F | Wt 148.8 lb

## 2021-06-17 DIAGNOSIS — I10 Essential (primary) hypertension: Secondary | ICD-10-CM | POA: Diagnosis not present

## 2021-06-17 MED ORDER — AMLODIPINE BESYLATE 5 MG PO TABS: 5.0000 mg | ORAL_TABLET | Freq: Every day | ORAL | 3 refills | Status: DC

## 2021-06-17 NOTE — Patient Instructions (Signed)
Maintain current med dose Do not insert another testosterone pellet for another 61months.

## 2021-06-17 NOTE — Assessment & Plan Note (Signed)
Improved BP at amlodipine Denies any adverse effects BP Readings from Last 3 Encounters:  06/17/21 138/88  06/03/21 (!) 174/92  03/11/20 138/82   Maintain med dose Advised to avoid testosterone pellets for next 3months F/up in 60months

## 2021-06-17 NOTE — Progress Notes (Signed)
   Subjective:  Patient ID: Rachael Scott, female    DOB: 06/28/1966  Age: 55 y.o. MRN: [id number]  CC: Follow-up (2 week f/u on HTN. Pt states she has been checking BP at home and has noticed improvement. /Declines vaccine at this time. )  HPI  HTN, goal below 130/80 Improved BP at amlodipine Denies any adverse effects BP Readings from Last 3 Encounters:  06/17/21 138/88  06/03/21 (!) 174/92  03/11/20 138/82   Maintain med dose Advised to avoid testosterone pellets for next 32months F/up in 62months  BP Readings from Last 3 Encounters:  06/17/21 138/88  06/03/21 (!) 174/92  03/11/20 138/82    Reviewed past Medical, Social and Family history today.  Outpatient Medications Prior to Visit  Medication Sig Dispense Refill   acetaminophen (TYLENOL) 325 MG tablet Take 650 mg by mouth every 6 (six) hours as needed for pain.     imipramine (TOFRANIL) 50 MG tablet Take 50 mg by mouth daily.     promethazine (PHENERGAN) 25 MG tablet Take 1 tablet (25 mg total) by mouth every 6 (six) hours as needed for nausea or vomiting. 30 tablet 0   amLODipine (NORVASC) 5 MG tablet Take 1 tablet (5 mg total) by mouth at bedtime. 30 tablet 5   No facility-administered medications prior to visit.    ROS See HPI  Objective:  BP 138/88 (BP Location: Left Arm, Patient Position: Sitting, Cuff Size: Normal)   Pulse 60   Temp (!) 96.9 F (36.1 C) (Temporal)   Wt 148 lb 12.8 oz (67.5 kg)   LMP 02/19/2020   SpO2 100%   BMI 24.76 kg/m   Physical Exam Cardiovascular:     Rate and Rhythm: Normal rate.     Pulses: Normal pulses.  Pulmonary:     Effort: Pulmonary effort is normal.  Musculoskeletal:     Right lower leg: No edema.     Left lower leg: No edema.  Neurological:     Mental Status: She is alert and oriented to person, place, and time.   Assessment & Plan:  This visit occurred during the SARS-CoV-2 public health emergency.  Safety protocols were in place, including screening questions  prior to the visit, additional usage of staff PPE, and extensive cleaning of exam room while observing appropriate contact time as indicated for disinfecting solutions.   [name] was seen today for follow-up.  Diagnoses and all orders for this visit:  HTN, goal below 130/80 -     amLODipine (NORVASC) 5 MG tablet; Take 1 tablet (5 mg total) by mouth at bedtime.   Problem List Items Addressed This Visit       Cardiovascular and Mediastinum   HTN, goal below 130/80 - Primary    Improved BP at amlodipine Denies any adverse effects BP Readings from Last 3 Encounters:  06/17/21 138/88  06/03/21 (!) 174/92  03/11/20 138/82   Maintain med dose Advised to avoid testosterone pellets for next 11months F/up in 25months      Relevant Medications   amLODipine (NORVASC) 5 MG tablet     Follow-up: Return in about 3 months (around 09/17/2021) for CPE(fasting).  Wilfred Lacy, NP

## 2021-07-03 ENCOUNTER — Telehealth: Payer: BC Managed Care – PPO | Admitting: Nurse Practitioner

## 2021-07-03 DIAGNOSIS — U071 COVID-19: Secondary | ICD-10-CM

## 2021-07-03 MED ORDER — MOLNUPIRAVIR EUA 200MG CAPSULE: 4.0000 | ORAL_CAPSULE | Freq: Two times a day (BID) | ORAL | 0 refills | Status: AC

## 2021-07-03 NOTE — Patient Instructions (Signed)
Nelson Chimes, thank you for joining Gildardo Pounds, NP for today's virtual visit.  While this provider is not your primary care provider (PCP), if your PCP is located in our provider database this encounter information will be shared with them immediately following your visit.  Consent: (Patient) Rachael Scott provided verbal consent for this virtual visit at the beginning of the encounter.  Current Medications:  Current Outpatient Medications:    molnupiravir EUA (LAGEVRIO) 200 mg CAPS capsule, Take 4 capsules (800 mg total) by mouth 2 (two) times daily for 5 days., Disp: 40 capsule, Rfl: 0   acetaminophen (TYLENOL) 325 MG tablet, Take 650 mg by mouth every 6 (six) hours as needed for pain., Disp: , Rfl:    amLODipine (NORVASC) 5 MG tablet, Take 1 tablet (5 mg total) by mouth at bedtime., Disp: 90 tablet, Rfl: 3   imipramine (TOFRANIL) 50 MG tablet, Take 50 mg by mouth daily., Disp: , Rfl:    promethazine (PHENERGAN) 25 MG tablet, Take 1 tablet (25 mg total) by mouth every 6 (six) hours as needed for nausea or vomiting., Disp: 30 tablet, Rfl: 0   Medications ordered in this encounter:  Meds ordered this encounter  Medications   molnupiravir EUA (LAGEVRIO) 200 mg CAPS capsule    Sig: Take 4 capsules (800 mg total) by mouth 2 (two) times daily for 5 days.    Dispense:  40 capsule    Refill:  0    Order Specific Question:   Supervising Provider    Answer:   Sabra Heck, [place]     *If you need refills on other medications prior to your next appointment, please contact your pharmacy*  Follow-Up: Call back or seek an in-person evaluation if the symptoms worsen or if the condition fails to improve as anticipated.  Other Instructions Please keep well-hydrated and get plenty of rest. Start a saline nasal rinse to flush out your nasal passages. You can use plain Mucinex to help thin congestion. If you have a humidifier, running in the bedroom at night. I want you to start OTC  vitamin D3 1000 units daily, vitamin C 1000 mg daily, and a zinc supplement. Please take prescribed medications as directed.   You have been enrolled in a MyChart symptom monitoring program. Please answer these questions daily so we can keep track of how you are doing.   If PCR test positive, please see quarantine instructions below. I also want you to message me via MyChart, using the separate message I have sent you. This way you can communicate directly with me.    You were to quarantine for 5 days from onset of your symptoms.  After day 5, if you have had no fever and you are feeling better, you can end quarantine but need to mask for an additional 5 days. After day 5 if you have a fever or are having significant symptoms, please quarantine for full 10 days.   If you note any worsening of symptoms, any significant shortness of breath or any chest pain, please seek ER evaluation ASAP.  Please do not delay care!    If you have been instructed to have an in-person evaluation today at a local Urgent Care facility, please use the link below. It will take you to a list of all of our available [facility], including address, phone number and hours of operation. Please do not delay care.  [facility]  If you or a family  member do not have a primary care provider, use the link below to schedule a visit and establish care. When you choose a [facility] primary care physician or advanced practice provider, you gain a long-term partner in health. Find a Primary Care Provider  Learn more about [facility]'s in-office and virtual care options: [facility]

## 2021-07-03 NOTE — Progress Notes (Signed)
Virtual Visit Consent   Rachael Scott, you are scheduled for a virtual visit with a [facility] provider today.     Just as with appointments in the office, your consent must be obtained to participate.  Your consent will be active for this visit and any virtual visit you may have with one of our providers in the next 365 days.     If you have a MyChart account, a copy of this consent can be sent to you electronically.  All virtual visits are billed to your insurance company just like a traditional visit in the office.    As this is a virtual visit, video technology does not allow for your provider to perform a traditional examination.  This may limit your provider's ability to fully assess your condition.  If your provider identifies any concerns that need to be evaluated in person or the need to arrange testing (such as labs, EKG, etc.), we will make arrangements to do so.     Although advances in technology are sophisticated, we cannot ensure that it will always work on either your end or our end.  If the connection with a video visit is poor, the visit may have to be switched to a telephone visit.  With either a video or telephone visit, we are not always able to ensure that we have a secure connection.     I need to obtain your verbal consent now.   Are you willing to proceed with your visit today?    Rachael Scott has provided verbal consent on 07/03/2021 for a virtual visit (video or telephone).   Gildardo Pounds, NP   Date: 07/03/2021 8:16 AM   Virtual Visit via Video Note   I, Gildardo Pounds, connected with  Rachael Scott  ([id number], 1965/12/20) on 07/03/21 at  8:15 AM EST by a video-enabled telemedicine application and verified that I am speaking with the correct person using two identifiers.  Location: Patient: Virtual Visit Location Patient: Home Provider: Virtual Visit Location Provider: Home Office   I discussed the limitations of evaluation and management by  telemedicine and the availability of in person appointments. The patient expressed understanding and agreed to proceed.    History of Present Illness: Rachael Scott is a 55 y.o. who identifies as a female who was assigned female at birth, and is being seen today for [place] home antigen test.  HPI:  Home COVID test positive. Current symptoms include fever, sore throat, headache and loss of taste.  She has been COVID vaccinated.  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   Plantar fascial fibromatosis 05/21/2013   Attention deficit disorder without mention of hyperactivity 09/12/2011    Allergies: No Known Allergies Medications:  Current Outpatient Medications:    molnupiravir EUA (LAGEVRIO) 200 mg CAPS capsule, Take 4 capsules (800 mg total) by mouth 2 (two) times daily for 5 days., Disp: 40 capsule, Rfl: 0   acetaminophen (TYLENOL) 325 MG tablet, Take 650 mg by mouth every 6 (six) hours as needed for pain., Disp: , Rfl:    amLODipine (NORVASC) 5 MG tablet, Take 1 tablet (  5 mg total) by mouth at bedtime., Disp: 90 tablet, Rfl: 3   imipramine (TOFRANIL) 50 MG tablet, Take 50 mg by mouth daily., Disp: , Rfl:    promethazine (PHENERGAN) 25 MG tablet, Take 1 tablet (25 mg total) by mouth every 6 (six) hours as needed for nausea or vomiting., Disp: 30 tablet, Rfl: 0  Observations/Objective: Patient is well-developed, well-nourished in no acute distress.  Resting comfortably in bed at home.  Head is normocephalic, atraumatic.  No labored breathing.  Speech is clear and coherent with logical content.  Patient is alert and oriented at baseline.     Assessment and Plan: 1. Positive self-administered antigen test for COVID-19 - molnupiravir EUA (LAGEVRIO) 200 mg CAPS capsule; Take 4 capsules (800 mg total) by mouth 2 (two) times daily for 5 days.  Dispense: 40 capsule; Refill: 0 Please keep well-hydrated and get plenty of rest. Start a saline nasal rinse to flush out your nasal passages. You can use plain Mucinex to help thin congestion. If you have a humidifier, running in the bedroom at night. I want you to start OTC vitamin D3 1000 units daily, vitamin C 1000 mg daily, and a zinc supplement. Please take prescribed medications as directed.   You have been enrolled in a MyChart symptom monitoring program. Please answer these questions daily so we can keep track of how you are doing.   If PCR test positive, please see quarantine instructions below. I also want you to message me via MyChart, using the separate message I have sent you. This way you can communicate directly with me.    You were to quarantine for 5 days from onset of your symptoms.  After day 5, if you have had no fever and you are feeling better, you can end quarantine but need to mask for an additional 5 days. After day 5 if you have a fever or are having significant symptoms, please quarantine for full 10 days.   If you note any worsening of symptoms, any significant shortness of breath or any chest pain, please seek ER evaluation ASAP.  Please do not delay care!   Follow Up Instructions: I discussed the assessment and treatment plan with the patient. The patient was provided an opportunity to ask questions and all were answered. The patient agreed with the plan and demonstrated an understanding of the instructions.  A copy of instructions were sent to the patient via MyChart unless otherwise noted below.     The patient was advised to call back or seek an in-person evaluation if the symptoms worsen or if the condition fails to improve as anticipated.  Time:  I  spent 10 minutes with the patient via telehealth technology discussing the above problems/concerns.    Gildardo Pounds, NP

## 2021-09-17 ENCOUNTER — Ambulatory Visit: Payer: BC Managed Care – PPO | Admitting: Nurse Practitioner

## 2021-09-17 ENCOUNTER — Other Ambulatory Visit: Payer: Self-pay

## 2021-09-17 ENCOUNTER — Encounter: Payer: Self-pay | Admitting: Nurse Practitioner

## 2021-09-17 VITALS — BP 102/72 | HR 60 | Temp 97.0°F | Ht 65.0 in | Wt 142.6 lb

## 2021-09-17 DIAGNOSIS — Z23 Encounter for immunization: Secondary | ICD-10-CM | POA: Diagnosis not present

## 2021-09-17 DIAGNOSIS — Z1211 Encounter for screening for malignant neoplasm of colon: Secondary | ICD-10-CM

## 2021-09-17 DIAGNOSIS — R8290 Unspecified abnormal findings in urine: Secondary | ICD-10-CM | POA: Diagnosis not present

## 2021-09-17 DIAGNOSIS — I10 Essential (primary) hypertension: Secondary | ICD-10-CM | POA: Diagnosis not present

## 2021-09-17 DIAGNOSIS — D492 Neoplasm of unspecified behavior of bone, soft tissue, and skin: Secondary | ICD-10-CM | POA: Diagnosis not present

## 2021-09-17 DIAGNOSIS — E782 Mixed hyperlipidemia: Secondary | ICD-10-CM

## 2021-09-17 DIAGNOSIS — E559 Vitamin D deficiency, unspecified: Secondary | ICD-10-CM

## 2021-09-17 DIAGNOSIS — Z1231 Encounter for screening mammogram for malignant neoplasm of breast: Secondary | ICD-10-CM

## 2021-09-17 NOTE — Patient Instructions (Signed)
Go to lab for blood draw and urine collection.  You will be contacted to schedule an appt for mammogram and with dermatology You will be contacted about cologuard test kit.  Maintain current medications

## 2021-09-17 NOTE — Assessment & Plan Note (Signed)
Bp at goal with amlodipine BP Readings from Last 3 Encounters:  09/17/21 102/72  06/17/21 138/88  06/03/21 (!) 174/92   She also maintains regular exercise and heart healthy diet Maintain med dose Check BMP F/up in 58months

## 2021-09-17 NOTE — Progress Notes (Signed)
Subjective:  Patient ID: Rachael Scott, female    DOB: September 16, 1965  Age: 56 y.o. MRN: [id number]  CC: Follow-up (3 month f/u on HTN, medication refill needed for Amlodipine. /Tdap vaccine given today. )  HPI Reports skin lesion on left upper arm, present for over 27yrs, stays scaly and erythematous. She needs referral to [facility] dermatology. She also request mammogram and cologuard order, last completed 2019 (normal). She denies any change in GI function.  HTN, goal below 130/80 Bp at goal with amlodipine BP Readings from Last 3 Encounters:  09/17/21 102/72  06/17/21 138/88  06/03/21 (!) 174/92   She also maintains regular exercise and heart healthy diet Maintain med dose Check BMP F/up in 80months   Vitamin D deficiency Repeat vit. D  Reviewed past Medical, Social and Family history today.  Outpatient Medications Prior to Visit  Medication Sig Dispense Refill   acetaminophen (TYLENOL) 325 MG tablet Take 650 mg by mouth every 6 (six) hours as needed for pain.     amLODipine (NORVASC) 5 MG tablet Take 1 tablet (5 mg total) by mouth at bedtime. 90 tablet 3   imipramine (TOFRANIL) 50 MG tablet Take 50 mg by mouth daily.     promethazine (PHENERGAN) 25 MG tablet Take 1 tablet (25 mg total) by mouth every 6 (six) hours as needed for nausea or vomiting. 30 tablet 0   No facility-administered medications prior to visit.   ROS See HPI  Objective:  BP 102/72 (BP Location: Left Arm, Patient Position: Sitting, Cuff Size: Normal)    Pulse 60    Temp (!) 97 F (36.1 C) (Temporal)    Ht 5\' 5"  (1.651 m)    Wt 142 lb 9.6 oz (64.7 kg)    LMP 02/19/2020    SpO2 98%    BMI 23.73 kg/m   Physical Exam Vitals reviewed.  Cardiovascular:     Rate and Rhythm: Normal rate and regular rhythm.     Pulses: Normal pulses.     Heart sounds: Normal heart sounds.  Pulmonary:     Effort: Pulmonary effort is normal.     Breath sounds: Normal breath sounds.  Musculoskeletal:     Right lower leg: No  edema.     Left lower leg: No edema.  Skin:    Findings: Lesion and rash present. Rash is macular and scaling.  Neurological:     Mental Status: She is oriented to person, place, and time.   Assessment & Plan:  This visit occurred during the SARS-CoV-2 public health emergency.  Safety protocols were in place, including screening questions prior to the visit, additional usage of staff PPE, and extensive cleaning of exam room while observing appropriate contact time as indicated for disinfecting solutions.   Rachael Scott was seen today for follow-up.  Diagnoses and all orders for this visit:  HTN, goal below 130/80 -     Cancel: CBC -     CBC; Future  Abnormal urinalysis -     Urinalysis w microscopic + reflex cultur  Atypical squamoproliferative skin lesion Comments: left upper arm Orders: -     Ambulatory referral to Dermatology  Mixed hyperlipidemia -     Cancel: Lipid panel -     Lipid panel; Future  Vitamin D deficiency -     Cancel: Vitamin D (25 hydroxy) -     Vitamin D (25 hydroxy); Future  Colon cancer screening -     Cologuard; Future  Breast cancer screening by mammogram -  MM 3D SCREEN BREAST BILATERAL; Future  Need for diphtheria-tetanus-pertussis (Tdap) vaccine -     Tdap vaccine greater than or equal to 7yo IM   Problem List Items Addressed This Visit       Cardiovascular and Mediastinum   HTN, goal below 130/80 - Primary    Bp at goal with amlodipine BP Readings from Last 3 Encounters:  09/17/21 102/72  06/17/21 138/88  06/03/21 (!) 174/92   She also maintains regular exercise and heart healthy diet Maintain med dose Check BMP F/up in 89months       Relevant Orders   CBC     Other   Hyperlipidemia   Relevant Orders   Lipid panel   Vitamin D deficiency    Repeat vit. D      Relevant Orders   Vitamin D (25 hydroxy)   Other Visit Diagnoses     Abnormal urinalysis       Relevant Orders   Urinalysis w microscopic + reflex cultur    Atypical squamoproliferative skin lesion       left upper arm   Relevant Orders   Ambulatory referral to Dermatology   Colon cancer screening       Relevant Orders   Cologuard   Breast cancer screening by mammogram       Relevant Orders   MM 3D SCREEN BREAST BILATERAL   Need for diphtheria-tetanus-pertussis (Tdap) vaccine       Relevant Orders   Tdap vaccine greater than or equal to 7yo IM (Completed)       Follow-up: Return in about 6 months (around 03/17/2022) for CPE (fasting, pelvic and breast exam).  Wilfred Lacy, NP

## 2021-09-17 NOTE — Assessment & Plan Note (Addendum)
Repeat vit. D °

## 2021-09-20 LAB — URINE CULTURE
MICRO NUMBER:: 12995537
Result:: NO GROWTH
SPECIMEN QUALITY:: ADEQUATE

## 2021-09-20 LAB — URINALYSIS W MICROSCOPIC + REFLEX CULTURE
Bilirubin Urine: NEGATIVE
Glucose, UA: NEGATIVE
Hgb urine dipstick: NEGATIVE
Hyaline Cast: NONE SEEN /LPF
Ketones, ur: NEGATIVE
Nitrites, Initial: NEGATIVE
Protein, ur: NEGATIVE
RBC / HPF: NONE SEEN /HPF (ref 0–2)
Specific Gravity, Urine: 1.013 (ref 1.001–1.035)
pH: 6 (ref 5.0–8.0)

## 2021-09-20 LAB — CULTURE INDICATED

## 2021-09-22 ENCOUNTER — Other Ambulatory Visit: Payer: Self-pay

## 2021-09-22 ENCOUNTER — Other Ambulatory Visit (INDEPENDENT_AMBULATORY_CARE_PROVIDER_SITE_OTHER): Payer: BC Managed Care – PPO

## 2021-09-22 DIAGNOSIS — I10 Essential (primary) hypertension: Secondary | ICD-10-CM

## 2021-09-22 DIAGNOSIS — E782 Mixed hyperlipidemia: Secondary | ICD-10-CM

## 2021-09-22 DIAGNOSIS — E559 Vitamin D deficiency, unspecified: Secondary | ICD-10-CM | POA: Diagnosis not present

## 2021-09-22 LAB — LIPID PANEL
Cholesterol: 259 mg/dL — ABNORMAL HIGH (ref 0–200)
HDL: 99.6 mg/dL (ref >39.00)
LDL Cholesterol: 146 mg/dL — ABNORMAL HIGH (ref 0–99)
NonHDL: 159.7
Total CHOL/HDL Ratio: 3
Triglycerides: 70 mg/dL (ref 0.0–149.0)
VLDL: 14 mg/dL (ref 0.0–40.0)

## 2021-09-22 LAB — VITAMIN D 25 HYDROXY (VIT D DEFICIENCY, FRACTURES): VITD: 21.42 ng/mL — ABNORMAL LOW (ref 30.00–100.00)

## 2021-09-22 NOTE — Progress Notes (Signed)
Per the orders of Rachael Scott pt is here for labs. Pt tolerated draw well.

## 2021-11-03 ENCOUNTER — Encounter: Payer: BC Managed Care – PPO | Admitting: Nurse Practitioner

## 2022-01-27 ENCOUNTER — Other Ambulatory Visit: Payer: Self-pay | Admitting: Nurse Practitioner

## 2022-01-27 DIAGNOSIS — I10 Essential (primary) hypertension: Secondary | ICD-10-CM

## 2022-01-27 NOTE — Telephone Encounter (Signed)
Chart Supports Rx Last OV: 09/2021 Next OV: 03/2022

## 2022-03-16 IMAGING — XA DG FLUORO GUIDE NDL PLC/BX
1 series · 1 of 1 positions shown · non-contrast
Comparison: none

CLINICAL DATA: Recent shoulder injury with pain and limited range
of motion.

[Series 1: ortho standard · 1 of 1 slices shown]
[im 1/1]
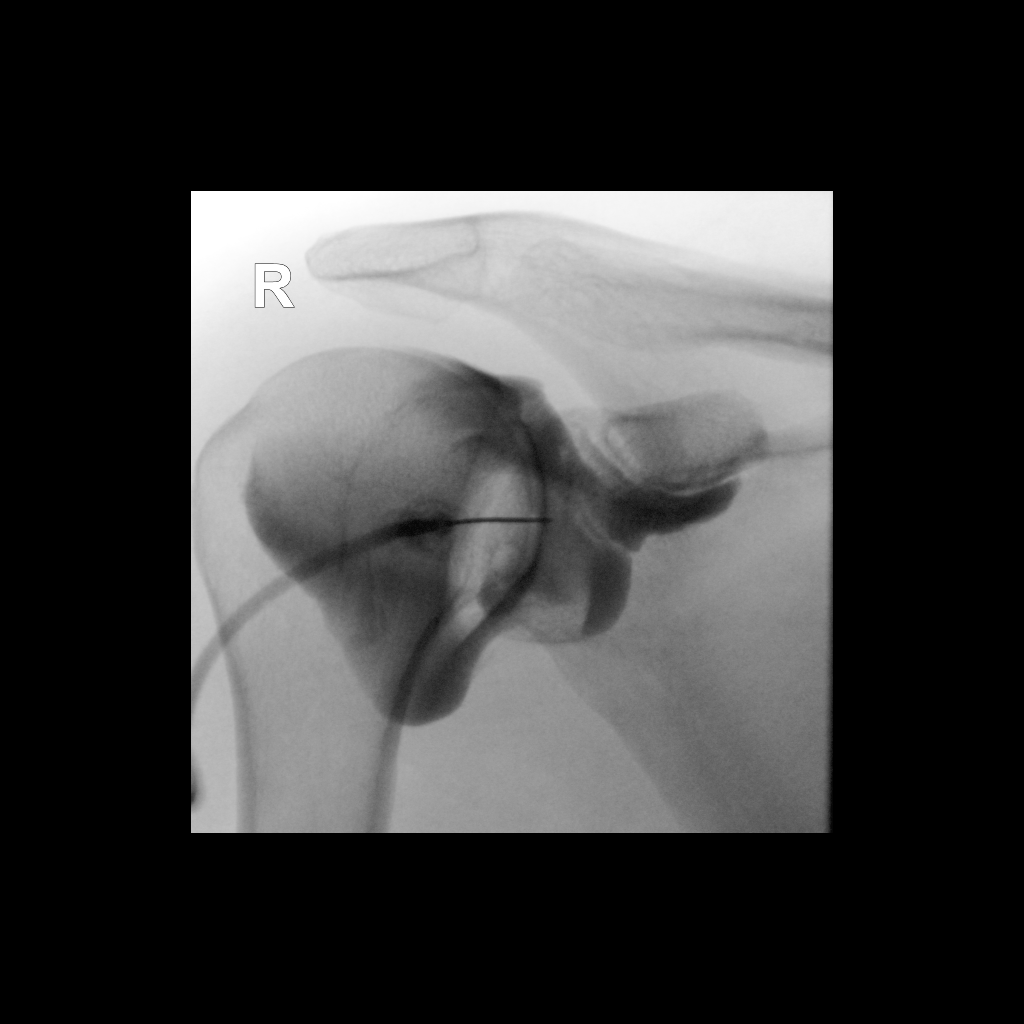

[1 of 1 positions shown; findings below may reference images not displayed]

FLUOROSCOPY TIME:  0 minutes 12 seconds. 4.92 micro gray meter
squared

PROCEDURE:
Right SHOULDER INJECTION UNDER FLUOROSCOPY

The skin overlying the shoulder was scrubbed with Betadine and
draped in sterile fashion. Skin and subcutaneous anesthesia was
carried out using a 25 gauge needle and 1% lidocaine. A 22 gauge
spinal needle was directed under fluoroscopic guidance on one pass
into the glenohumeral joint. 20 cc of a mixture of 0.1 cc MultiHance
and dilute Isovue 200 was then used to fill the glenohumeral joint.
IMPRESSION: Technically successful right shoulder injection for MRI.

## 2022-03-17 ENCOUNTER — Other Ambulatory Visit (HOSPITAL_COMMUNITY)
Admission: RE | Admit: 2022-03-17 | Discharge: 2022-03-17 | Disposition: A | Discharge status: Home / Self Care | Payer: BC Managed Care – PPO | Source: Ambulatory Visit | Attending: Nurse Practitioner | Admitting: Nurse Practitioner

## 2022-03-17 ENCOUNTER — Ambulatory Visit (INDEPENDENT_AMBULATORY_CARE_PROVIDER_SITE_OTHER): Payer: BC Managed Care – PPO | Admitting: Nurse Practitioner

## 2022-03-17 ENCOUNTER — Encounter: Payer: Self-pay | Admitting: Nurse Practitioner

## 2022-03-17 VITALS — BP 122/60 | HR 75 | Temp 96.6°F | Ht 65.0 in | Wt 146.8 lb

## 2022-03-17 DIAGNOSIS — E782 Mixed hyperlipidemia: Secondary | ICD-10-CM

## 2022-03-17 DIAGNOSIS — Z124 Encounter for screening for malignant neoplasm of cervix: Secondary | ICD-10-CM

## 2022-03-17 DIAGNOSIS — Z1211 Encounter for screening for malignant neoplasm of colon: Secondary | ICD-10-CM | POA: Diagnosis not present

## 2022-03-17 DIAGNOSIS — E559 Vitamin D deficiency, unspecified: Secondary | ICD-10-CM | POA: Diagnosis not present

## 2022-03-17 DIAGNOSIS — Z0001 Encounter for general adult medical examination with abnormal findings: Secondary | ICD-10-CM | POA: Insufficient documentation

## 2022-03-17 DIAGNOSIS — I10 Essential (primary) hypertension: Secondary | ICD-10-CM | POA: Diagnosis not present

## 2022-03-17 LAB — COMPREHENSIVE METABOLIC PANEL
ALT: 13 U/L (ref 0–35)
AST: 22 U/L (ref 0–37)
Albumin: 4.5 g/dL (ref 3.5–5.2)
Alkaline Phosphatase: 64 U/L (ref 39–117)
BUN: 17 mg/dL (ref 6–23)
CO2: 29 mEq/L (ref 19–32)
Calcium: 9.1 mg/dL (ref 8.4–10.5)
Chloride: 103 mEq/L (ref 96–112)
Creatinine, Ser: 0.86 mg/dL (ref 0.40–1.20)
GFR: 75.74 mL/min (ref >60.00)
Glucose, Bld: 87 mg/dL (ref 70–99)
Potassium: 4 mEq/L (ref 3.5–5.1)
Sodium: 137 mEq/L (ref 135–145)
Total Bilirubin: 0.4 mg/dL (ref 0.2–1.2)
Total Protein: 7.4 g/dL (ref 6.0–8.3)

## 2022-03-17 LAB — LIPID PANEL
Cholesterol: 232 mg/dL — ABNORMAL HIGH (ref 0–200)
HDL: 82 mg/dL (ref >39.00)
LDL Cholesterol: 137 mg/dL — ABNORMAL HIGH (ref 0–99)
NonHDL: 150.48
Total CHOL/HDL Ratio: 3
Triglycerides: 65 mg/dL (ref 0.0–149.0)
VLDL: 13 mg/dL (ref 0.0–40.0)

## 2022-03-17 LAB — CBC
HCT: 42.2 % (ref 36.0–46.0)
Hemoglobin: 14 g/dL (ref 12.0–15.0)
MCHC: 33.2 g/dL (ref 30.0–36.0)
MCV: 91.1 fl (ref 78.0–100.0)
Platelets: 185 10*3/uL (ref 150.0–400.0)
RBC: 4.63 Mil/uL (ref 3.87–5.11)
RDW: 13.5 % (ref 11.5–15.5)
WBC: 3.8 10*3/uL — ABNORMAL LOW (ref 4.0–10.5)

## 2022-03-17 LAB — VITAMIN D 25 HYDROXY (VIT D DEFICIENCY, FRACTURES): VITD: 20.45 ng/mL — ABNORMAL LOW (ref 30.00–100.00)

## 2022-03-17 MED ORDER — AMLODIPINE BESYLATE 5 MG PO TABS: 5.0000 mg | ORAL_TABLET | Freq: Every day | ORAL | 3 refills | Status: DC

## 2022-03-17 NOTE — Assessment & Plan Note (Signed)
BP at goal with amlodipine BP Readings from Last 3 Encounters:  03/17/22 122/60  09/17/21 102/72  06/17/21 138/88   Repeat CMP Maintain med dose Refill sent

## 2022-03-17 NOTE — Progress Notes (Signed)
Complete physical exam  Patient: Rachael Scott   DOB: Jan 13, 1966   56 y.o. Female  MRN: [id number] Visit Date: 03/17/2022  Subjective:    Chief Complaint  Patient presents with   Annual Exam    CPE Pt fasting Breast / pap exam  No concerns today    Rachael Scott is a 56 y.o. female who presents today for a complete physical exam. She reports consuming a low fat and low sodium diet.  Cardio and weight training daily  She generally feels well. She reports sleeping well. She does not have additional problems to discuss today.  Vision:Yes Dental:Yes STD Screen:No  Most recent fall risk assessment:    03/17/2022    8:53 AM  Fall Risk   Falls in the past year? 0  Number falls in past yr: 0  Injury with Fall? 0     Most recent depression screenings:    06/17/2021   11:48 AM 06/08/2020    3:25 PM  PHQ 2/9 Scores  PHQ - 2 Score 0 0    HPI  HTN, goal below 130/80 BP at goal with amlodipine BP Readings from Last 3 Encounters:  03/17/22 122/60  09/17/21 102/72  06/17/21 138/88   Repeat CMP Maintain med dose Refill sent  Hyperlipidemia Repeat lipid panel  Vitamin D deficiency Repeat Vit. D   Past Medical History:  Diagnosis Date   ADD (attention deficit disorder)    Gallbladder disease    Headache    3 a mo   History of chickenpox    Past Surgical History:  Procedure Laterality Date   CESAREAN SECTION  1992   CHOLECYSTECTOMY     ENDOMETRIAL ABLATION     tummy  2001   tummy tuck   Social History   Socioeconomic History   Marital status: Married    Spouse name: Not on file   Number of children: Not on file   Years of education: Not on file   Highest education level: Not on file  Occupational History   Not on file  Tobacco Use   Smoking status: Former    Packs/day: 0.50    Years: 15.00    Total pack years: 7.50    Types: Cigarettes    Quit date: 08/01/2003    Years since quitting: 18.6   Smokeless tobacco: Never  Vaping Use   Vaping Use:  Never used  Substance and Sexual Activity   Alcohol use: Yes    Comment:  rare - once a month glass of wine   Drug use: No   Sexual activity: Not on file  Other Topics Concern   Not on file  Social History Narrative   Not on file   Social Determinants of Health   Financial Resource Strain: Not on file  Food Insecurity: Not on file  Transportation Needs: Not on file  Physical Activity: Not on file  Stress: Not on file  Social Connections: Not on file  Intimate Partner Violence: Not on file   Family Status  Relation Name Status   Sister  Alive   Mother  (Not Specified)   Father  (Not Specified)   Family History  Problem Relation Age of Onset   Hypothyroidism Sister    Hypertension Mother    Hypertension Father    No Known Allergies  Patient Care Team: Kendle Erker, Bonna Gains, NP as PCP - General (Internal Medicine)   Medications: Outpatient Medications Prior to Visit  Medication Sig   acetaminophen (TYLENOL)  325 MG tablet Take 650 mg by mouth every 6 (six) hours as needed for pain.   imipramine (TOFRANIL) 50 MG tablet Take 50 mg by mouth daily.   promethazine (PHENERGAN) 25 MG tablet Take 1 tablet (25 mg total) by mouth every 6 (six) hours as needed for nausea or vomiting.   [DISCONTINUED] amLODipine (NORVASC) 5 MG tablet TAKE 1 TABLET(5 MG) BY MOUTH AT BEDTIME   No facility-administered medications prior to visit.    Review of Systems  Constitutional:  Negative for fever.  HENT:  Negative for congestion and sore throat.   Eyes:        Negative for visual changes  Respiratory:  Negative for cough and shortness of breath.   Cardiovascular:  Negative for chest pain, palpitations and leg swelling.  Gastrointestinal:  Negative for blood in stool, constipation and diarrhea.  Genitourinary:  Negative for dysuria, frequency and urgency.  Musculoskeletal:  Negative for myalgias.  Skin:  Negative for rash.  Neurological:  Negative for dizziness and headaches.   Hematological:  Does not bruise/bleed easily.  Psychiatric/Behavioral:  Negative for suicidal ideas. The patient is not nervous/anxious.         Objective:  BP 122/60 (BP Location: Right Arm, Patient Position: Sitting, Cuff Size: Small)   Pulse 75   Temp (!) 96.6 F (35.9 C) (Temporal)   Ht $R'5\' 5"'yZ$  (1.651 m)   Wt 146 lb 12.8 oz (66.6 kg)   LMP 02/19/2020   SpO2 98%   BMI 24.43 kg/m     BP Readings from Last 3 Encounters:  03/17/22 122/60  09/17/21 102/72  06/17/21 138/88   Wt Readings from Last 3 Encounters:  03/17/22 146 lb 12.8 oz (66.6 kg)  09/17/21 142 lb 9.6 oz (64.7 kg)  06/17/21 148 lb 12.8 oz (67.5 kg)      Physical Exam Vitals and nursing note reviewed. Exam conducted with a chaperone present.  Constitutional:      General: She is not in acute distress. HENT:     Right Ear: Tympanic membrane, ear canal and external ear normal.     Left Ear: Tympanic membrane, ear canal and external ear normal.     Nose: Nose normal.  Eyes:     General: No scleral icterus.    Extraocular Movements: Extraocular movements intact.     Conjunctiva/sclera: Conjunctivae normal.  Cardiovascular:     Rate and Rhythm: Normal rate and regular rhythm.     Pulses: Normal pulses.     Heart sounds: Normal heart sounds.  Pulmonary:     Effort: Pulmonary effort is normal. No respiratory distress.     Breath sounds: Normal breath sounds.  Chest:  Breasts:    Breasts are symmetrical.     Right: Normal.     Left: Normal.  Abdominal:     General: Bowel sounds are normal. There is no distension.     Palpations: Abdomen is soft.     Hernia: There is no hernia in the left inguinal area or right inguinal area.  Genitourinary:    General: Normal vulva.     Labia:        Right: No rash, tenderness or lesion.        Left: No rash, tenderness or lesion.      Urethra: No prolapse, urethral pain or urethral swelling.     Vagina: Vaginal discharge present.     Cervix: Discharge present. No  cervical motion tenderness, friability, lesion or erythema.     Uterus:  Normal.      Adnexa: Right adnexa normal and left adnexa normal.  Musculoskeletal:        General: Normal range of motion.     Cervical back: Normal range of motion and neck supple.     Right lower leg: No edema.     Left lower leg: No edema.  Lymphadenopathy:     Cervical: No cervical adenopathy.     Upper Body:     Right upper body: No supraclavicular, axillary or pectoral adenopathy.     Left upper body: No supraclavicular, axillary or pectoral adenopathy.     Lower Body: No right inguinal adenopathy. No left inguinal adenopathy.  Skin:    General: Skin is warm and dry.  Neurological:     Mental Status: She is alert and oriented to person, place, and time.  Psychiatric:        Mood and Affect: Mood normal.        Behavior: Behavior normal.        Thought Content: Thought content normal.     No results found for any visits on 03/17/22.    Assessment & Plan:    Routine Health Maintenance and Physical Exam  Immunization History  Administered Date(s) Administered   Influenza,inj,Quad PF,6+ Mos 06/13/2018   PFIZER(Purple Top)SARS-COV-2 Vaccination 10/21/2019, 11/11/2019, 01/02/2021   Tdap 09/17/2021   Health Maintenance  Topic Date Due   MAMMOGRAM  Never done   PAP SMEAR-Modifier  09/05/2020   Fecal DNA (Cologuard)  09/18/2020   INFLUENZA VACCINE  03/08/2022   COVID-19 Vaccine (4 - Pfizer series) 04/02/2022 (Originally 02/27/2021)   Zoster Vaccines- Shingrix (1 of 2) 06/17/2022 (Originally 03/16/2016)   Hepatitis C Screening  09/17/2022 (Originally 03/16/1984)   HIV Screening  09/17/2022 (Originally 03/16/1981)   TETANUS/TDAP  09/18/2031   HPV VACCINES  Aged Out   Discussed health benefits of physical activity, and encouraged her to engage in regular exercise appropriate for her age and condition. Schedule appt for mammogram Collect an submit cologuard kit  Problem List Items Addressed This Visit        Cardiovascular and Mediastinum   HTN, goal below 130/80    BP at goal with amlodipine BP Readings from Last 3 Encounters:  03/17/22 122/60  09/17/21 102/72  06/17/21 138/88   Repeat CMP Maintain med dose Refill sent      Relevant Medications   amLODipine (NORVASC) 5 MG tablet     Other   Hyperlipidemia    Repeat lipid panel      Relevant Medications   amLODipine (NORVASC) 5 MG tablet   Other Relevant Orders   Lipid panel   Vitamin D deficiency    Repeat Vit. D      Relevant Orders   VITAMIN D 25 Hydroxy (Vit-D Deficiency, Fractures)   Other Visit Diagnoses     Encounter for preventative adult health care exam with abnormal findings    -  Primary   Relevant Orders   CBC   Comprehensive metabolic panel   Cytology - PAP   Colon cancer screening       Relevant Orders   Cologuard   Encounter for Papanicolaou smear for cervical cancer screening       Relevant Orders   Cytology - PAP      Return in about 1 year (around 03/18/2023) for CPE (fasting).     Wilfred Lacy, NP

## 2022-03-17 NOTE — Assessment & Plan Note (Signed)
Repeat lipid panel ?

## 2022-03-17 NOTE — Assessment & Plan Note (Signed)
Repeat Vit. D 

## 2022-03-17 NOTE — Patient Instructions (Addendum)
Schedule appt for mammogram Collect an submit cologuard kit Maintain med doses Go to lab  Preventive Care 56-56 Years Old, Female Preventive care refers to lifestyle choices and visits with your health care provider that can promote health and wellness. Preventive care visits are also called wellness exams. What can I expect for my preventive care visit? Counseling Your health care provider may ask you questions about your: Medical history, including: Past medical problems. Family medical history. Pregnancy history. Current health, including: Menstrual cycle. Method of birth control. Emotional well-being. Home life and relationship well-being. Sexual activity and sexual health. Lifestyle, including: Alcohol, nicotine or tobacco, and drug use. Access to firearms. Diet, exercise, and sleep habits. Work and work Statistician. Sunscreen use. Safety issues such as seatbelt and bike helmet use. Physical exam Your health care provider will check your: Height and weight. These may be used to calculate your BMI (body mass index). BMI is a measurement that tells if you are at a healthy weight. Waist circumference. This measures the distance around your waistline. This measurement also tells if you are at a healthy weight and may help predict your risk of certain diseases, such as type 2 diabetes and high blood pressure. Heart rate and blood pressure. Body temperature. Skin for abnormal spots. What immunizations do I need?  Vaccines are usually given at various ages, according to a schedule. Your health care provider will recommend vaccines for you based on your age, medical history, and lifestyle or other factors, such as travel or where you work. What tests do I need? Screening Your health care provider may recommend screening tests for certain conditions. This may include: Lipid and cholesterol levels. Diabetes screening. This is done by checking your blood sugar (glucose) after you  have not eaten for a while (fasting). Pelvic exam and Pap test. Hepatitis B test. Hepatitis C test. HIV (human immunodeficiency virus) test. STI (sexually transmitted infection) testing, if you are at risk. Lung cancer screening. Colorectal cancer screening. Mammogram. Talk with your health care provider about when you should start having regular mammograms. This may depend on whether you have a family history of breast cancer. BRCA-related cancer screening. This may be done if you have a family history of breast, ovarian, tubal, or peritoneal cancers. Bone density scan. This is done to screen for osteoporosis. Talk with your health care provider about your test results, treatment options, and if necessary, the need for more tests. Follow these instructions at home: Eating and drinking  Eat a diet that includes fresh fruits and vegetables, whole grains, lean protein, and low-fat dairy products. Take vitamin and mineral supplements as recommended by your health care provider. Do not drink alcohol if: Your health care provider tells you not to drink. You are pregnant, may be pregnant, or are planning to become pregnant. If you drink alcohol: Limit how much you have to 0-1 drink a day. Know how much alcohol is in your drink. In the [place] one drink equals one 12 oz bottle of beer (355 mL), one 5 oz glass of wine (148 mL), or one 1 oz glass of hard liquor (44 mL). Lifestyle Brush your teeth every morning and night with fluoride toothpaste. Floss one time each day. Exercise for at least 30 minutes 5 or more days each week. Do not use any products that contain nicotine or tobacco. These products include cigarettes, chewing tobacco, and vaping devices, such as e-cigarettes. If you need help quitting, ask your health care provider. Do not use drugs. If  you are sexually active, practice safe sex. Use a condom or other form of protection to prevent STIs. If you do not wish to become pregnant, use  a form of birth control. If you plan to become pregnant, see your health care provider for a prepregnancy visit. Take aspirin only as told by your health care provider. Make sure that you understand how much to take and what form to take. Work with your health care provider to find out whether it is safe and beneficial for you to take aspirin daily. Find healthy ways to manage stress, such as: Meditation, yoga, or listening to music. Journaling. Talking to a trusted person. Spending time with friends and family. Minimize exposure to UV radiation to reduce your risk of skin cancer. Safety Always wear your seat belt while driving or riding in a vehicle. Do not drive: If you have been drinking alcohol. Do not ride with someone who has been drinking. When you are tired or distracted. While texting. If you have been using any mind-altering substances or drugs. Wear a helmet and other protective equipment during sports activities. If you have firearms in your house, make sure you follow all gun safety procedures. Seek help if you have been physically or sexually abused. What's next? Visit your health care provider once a year for an annual wellness visit. Ask your health care provider how often you should have your eyes and teeth checked. Stay up to date on all vaccines. This information is not intended to replace advice given to you by your health care provider. Make sure you discuss any questions you have with your health care provider. Document Revised: 01/20/2021 Document Reviewed: 01/20/2021 Elsevier Patient Education  [place].

## 2022-03-21 LAB — CYTOLOGY - PAP
Adequacy: ABSENT
Comment: NEGATIVE
Diagnosis: UNDETERMINED — AB
High risk HPV: NEGATIVE

## 2022-03-23 ENCOUNTER — Telehealth: Payer: Self-pay | Admitting: Nurse Practitioner

## 2022-03-23 NOTE — Telephone Encounter (Signed)
Caller Name: Rachael Scott Call back phone #: [phone]  Reason for Call: Asked for a call back to speak with her about results from her recent PAP smear, had some concerns about a comment about her cells.

## 2022-03-24 NOTE — Telephone Encounter (Signed)
Called & spoke w/ pt about lab results.

## 2022-03-24 NOTE — Telephone Encounter (Signed)
LVM, Adv pt to call back

## 2022-03-30 ENCOUNTER — Ambulatory Visit
Admission: RE | Admit: 2022-03-30 | Discharge: 2022-03-30 | Disposition: A | Discharge status: Home / Self Care | Payer: BC Managed Care – PPO | Source: Ambulatory Visit | Attending: Nurse Practitioner | Admitting: Nurse Practitioner

## 2022-03-30 DIAGNOSIS — Z1231 Encounter for screening mammogram for malignant neoplasm of breast: Secondary | ICD-10-CM

## 2022-04-01 NOTE — Telephone Encounter (Signed)
Na

## 2022-04-08 LAB — COLOGUARD: COLOGUARD: NEGATIVE

## 2022-04-28 ENCOUNTER — Other Ambulatory Visit: Payer: Self-pay | Admitting: *Deleted

## 2022-04-28 DIAGNOSIS — M79604 Pain in right leg: Secondary | ICD-10-CM

## 2022-05-05 ENCOUNTER — Ambulatory Visit (HOSPITAL_COMMUNITY)
Admission: RE | Admit: 2022-05-05 | Discharge: 2022-05-05 | Disposition: A | Discharge status: Home / Self Care | Payer: BC Managed Care – PPO | Source: Ambulatory Visit | Attending: Vascular Surgery | Admitting: Vascular Surgery

## 2022-05-05 ENCOUNTER — Ambulatory Visit: Payer: BC Managed Care – PPO | Admitting: Physician Assistant

## 2022-05-05 VITALS — BP 112/75 | HR 82 | Temp 97.6°F | Resp 20 | Ht 65.0 in | Wt 150.7 lb

## 2022-05-05 DIAGNOSIS — I872 Venous insufficiency (chronic) (peripheral): Secondary | ICD-10-CM | POA: Diagnosis not present

## 2022-05-05 DIAGNOSIS — I83812 Varicose veins of left lower extremities with pain: Secondary | ICD-10-CM | POA: Diagnosis not present

## 2022-05-05 DIAGNOSIS — M79604 Pain in right leg: Secondary | ICD-10-CM | POA: Diagnosis not present

## 2022-05-05 DIAGNOSIS — M79605 Pain in left leg: Secondary | ICD-10-CM | POA: Insufficient documentation

## 2022-05-05 NOTE — Progress Notes (Signed)
Office Note     CC:  follow up Requesting Provider:  Flossie Buffy, NP  HPI: Rachael Scott is a 56 y.o. ([birth year]) female who presents for evaluation of left lower extremity edema and painful varicose veins.  She was seen in our office prior to the [place] pandemic.  Patient states at the time she was fitted for thigh-high compression and had been approved for ablation therapy.  Unfortunately her father passed away then the [place] pandemic hit and she was never able to find time to arrange treatment of her left greater saphenous vein.  She has been wearing her compression.  She has been elevate her leg periodically throughout the day above the level of her heart.  She is avoiding prolonged standing and sitting.  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  She denies history of DVT, venous ulcerations, trauma, or prior vascular intervention.  She denies tobacco use.   Past Medical History:  Diagnosis Date   ADD (attention deficit disorder)    Gallbladder disease    Headache    3 a mo   History of chickenpox     Past Surgical History:  Procedure Laterality Date   CESAREAN SECTION  1992   CHOLECYSTECTOMY     ENDOMETRIAL ABLATION     tummy  2001   tummy tuck    Social History   Socioeconomic History   Marital status: Married    Spouse name: Not on file   Number of children: Not on file   Years of education: Not on file   Highest education level: Not on file  Occupational History   Not on file  Tobacco Use   Smoking status: Former    Packs/day: 0.50    Years: 15.00    Total pack years: 7.50    Types: Cigarettes    Quit date: 08/01/2003    Years since quitting: 18.7    Passive exposure: Never   Smokeless tobacco: Never  Vaping Use   Vaping Use: Never used  Substance and Sexual Activity   Alcohol use: Yes    Comment:  rare - once a month glass of wine   Drug use: No   Sexual  activity: Not on file  Other Topics Concern   Not on file  Social History Narrative   Not on file   Social Determinants of Health   Financial Resource Strain: Not on file  Food Insecurity: Not on file  Transportation Needs: Not on file  Physical Activity: Not on file  Stress: Not on file  Social Connections: Not on file  Intimate Partner Violence: Not on file    Family History  Problem Relation Age of Onset   Hypothyroidism Sister    Hypertension Mother    Hypertension Father     Current Outpatient Medications  Medication Sig Dispense Refill   acetaminophen (TYLENOL) 325 MG tablet Take 650 mg by mouth every 6 (six) hours as needed for pain.     amLODipine (NORVASC) 5 MG tablet Take 1 tablet (5 mg total) by mouth daily. 90 tablet 3   imipramine (TOFRANIL) 50 MG tablet Take 50 mg by mouth daily.     promethazine (PHENERGAN) 25 MG tablet Take 1 tablet (25 mg total) by mouth every 6 (six) hours as needed for nausea or vomiting. 30 tablet 0   No current facility-administered medications for this visit.    No Known Allergies  REVIEW OF SYSTEMS:   '[X]'$  denotes positive finding, '[ ]'$  denotes negative finding Cardiac  Comments:  Chest pain or chest pressure:    Shortness of breath upon exertion:    Short of breath when lying flat:    Irregular heart rhythm:        Vascular    Pain in calf, thigh, or hip brought on by ambulation:    Pain in feet at night that wakes you up from your sleep:     Blood clot in your veins:    Leg swelling:         Pulmonary    Oxygen at home:    Productive cough:     Wheezing:         Neurologic    Sudden weakness in arms or legs:     Sudden numbness in arms or legs:     Sudden onset of difficulty speaking or slurred speech:    Temporary loss of vision in one eye:     Problems with dizziness:         Gastrointestinal    Blood in stool:     Vomited blood:         Genitourinary    Burning when urinating:     Blood in urine:         Psychiatric    Major depression:         Hematologic    Bleeding problems:    Problems with blood clotting too easily:        Skin    Rashes or ulcers:        Constitutional    Fever or chills:      PHYSICAL EXAMINATION:  Vitals:   05/05/22 1406  BP: 112/75  Pulse: 82  Resp: 20  Temp: 97.6 F (36.4 C)  TempSrc: Temporal  SpO2: 100%  Weight: 150 lb 11.2 oz (68.4 kg)  Height: '5\' 5"'$  (1.651 m)    General:  WDWN in NAD; vital signs documented above Gait: Not observed HENT: WNL, normocephalic Pulmonary: normal non-labored breathing , without Rales, rhonchi,  wheezing Cardiac: regular HR Abdomen: soft, NT, no masses Skin: without rashes Vascular Exam/Pulses:  Right Left  Radial 2+ (normal) 2+ (normal)  DP 2+ (normal) 2+ (normal)   Extremities: without ischemic changes, without Gangrene , without cellulitis; without open wounds;  Musculoskeletal: no muscle wasting or atrophy  Neurologic: A&O X 3;  No focal weakness or paresthesias are detected Psychiatric:  The pt has Normal affect.   Non-Invasive Vascular Imaging:   Left lower extremity venous reflux study Negative for DVT Incompetent common femoral vein Incompetent left greater saphenous vein throughout the thigh with diameter greater than 6 mm   ASSESSMENT/PLAN:: 56 y.o. female here for evaluation of painful varicose veins of left lower extremity  -This patient was evaluated for left greater saphenous vein reflux with painful varicosities prior to the pandemic.  She has been wearing thigh-high compression regularly.  Patient also elevates her legs periodically above the level of her heart during the day and avoids prolonged sitting and standing.  Despite these conservative management techniques she still has a great deal of discomfort associated with her varicose veins.  Venous reflux study demonstrates a largely incompetent left greater saphenous vein over 6 mm in diameter.  She will continue the above  conservative measures and follow-up with one of our vein surgeons for consultation for possible left greater saphenous laser ablation as well as stab phlebectomy.   Rodman Key  [facility] [phone]  Clinic MD:   Scot Dock

## 2022-05-11 ENCOUNTER — Encounter: Payer: Self-pay | Admitting: Vascular Surgery

## 2022-05-11 ENCOUNTER — Ambulatory Visit: Payer: BC Managed Care – PPO | Admitting: Vascular Surgery

## 2022-05-11 VITALS — BP 119/71 | HR 90 | Temp 98.6°F | Resp 18 | Ht 65.0 in | Wt 152.4 lb

## 2022-05-11 DIAGNOSIS — I872 Venous insufficiency (chronic) (peripheral): Secondary | ICD-10-CM | POA: Diagnosis not present

## 2022-05-11 DIAGNOSIS — I83812 Varicose veins of left lower extremities with pain: Secondary | ICD-10-CM

## 2022-05-11 NOTE — Progress Notes (Signed)
REASON FOR VISIT:   Follow-up of painful varicose veins of the left lower extremity.  MEDICAL ISSUES:   CHRONIC VENOUS INSUFFICIENCY: This patient has CEAP C4 venous disease (hyperpigmentation).  We have again discussed the importance of intermittent leg elevation and the proper positioning for this.  I have encouraged her to continue to wear her thigh-high compression stocking with a gradient of 20 to 30 mmHg.  I have encouraged her to avoid prolonged sitting and standing.  We have discussed the importance of exercise specifically walking and water aerobics.  Given that she is having persistent symptoms I think she would be a good candidate for laser ablation of the left great saphenous vein and 10-20 stab phlebectomies.  I would likely cannulate the vein around the knee.  I have discussed the indications for endovenous laser ablation of the left GSV, that is to lower the pressure in the veins and potentially help relieve the symptoms from venous hypertension.  I have also discussed alternative options such as conservative treatment as described above. I have discussed the potential complications of the procedure, including bleeding, bruising, leg swelling, deep venous thrombosis (<1% risk), or failure of the vein to close <1% risk).  I have also explained that venous insufficiency is a chronic disease, and that the patient is at risk for recurrent varicose veins in the future.  All of the patient's questions were encouraged and answered. They are agreeable to proceed.   I have discussed with the patient the indications for stab phlebectomy.  I have explained to the patient that that will have small scars from the stab incisions.  I explained that the other risks include bruising, bleeding, and phlebitis.    HPI:   Rachael Scott is a pleasant 56 y.o. female who was seen by Arlee Muslim PA on 05/05/2022.  She had varicose veins in the left lower extremity.  She has significant superficial venous  reflux.  She had previously been evaluated elsewhere and approved for laser ablation of the left great saphenous vein and stab phlebectomies.  Unfortunately her father then died in the [place] pandemic occurred and therefore she put this off.  She returns now as she is having significant symptoms related to her varicose veins in the left leg.  She describes aching pain and heaviness which is aggravated by sitting and standing and relieved with elevation.  She has been wearing the thigh-high compression stockings with a gradient of 20 to 30 mmHg.  These do help her symptoms some.  She denies any previous history of DVT.  She had no previous venous procedures.  She also elevates her legs which help.  She exercises quite a bit and is very active.  Past Medical History:  Diagnosis Date   ADD (attention deficit disorder)    Gallbladder disease    Headache    3 a mo   History of chickenpox     Family History  Problem Relation Age of Onset   Hypothyroidism Sister    Hypertension Mother    Hypertension Father     SOCIAL HISTORY: Social History   Tobacco Use   Smoking status: Former    Packs/day: 0.50    Years: 15.00    Total pack years: 7.50    Types: Cigarettes    Quit date: 08/01/2003    Years since quitting: 18.7    Passive exposure: Never   Smokeless tobacco: Never  Substance Use Topics   Alcohol use: Yes    Comment:  rare - once a month glass of wine    No Known Allergies  Current Outpatient Medications  Medication Sig Dispense Refill   acetaminophen (TYLENOL) 325 MG tablet Take 650 mg by mouth every 6 (six) hours as needed for pain.     amLODipine (NORVASC) 5 MG tablet Take 1 tablet (5 mg total) by mouth daily. 90 tablet 3   imipramine (TOFRANIL) 50 MG tablet Take 50 mg by mouth daily.     promethazine (PHENERGAN) 25 MG tablet Take 1 tablet (25 mg total) by mouth every 6 (six) hours as needed for nausea or vomiting. 30 tablet 0   No current facility-administered medications  for this visit.    REVIEW OF SYSTEMS:  '[X]'$  denotes positive finding, '[ ]'$  denotes negative finding Cardiac  Comments:  Chest pain or chest pressure:    Shortness of breath upon exertion:    Short of breath when lying flat:    Irregular heart rhythm:        Vascular    Pain in calf, thigh, or hip brought on by ambulation:    Pain in feet at night that wakes you up from your sleep:     Blood clot in your veins:    Leg swelling:  x       Pulmonary    Oxygen at home:    Productive cough:     Wheezing:         Neurologic    Sudden weakness in arms or legs:     Sudden numbness in arms or legs:     Sudden onset of difficulty speaking or slurred speech:    Temporary loss of vision in one eye:     Problems with dizziness:         Gastrointestinal    Blood in stool:     Vomited blood:         Genitourinary    Burning when urinating:     Blood in urine:        Psychiatric    Major depression:         Hematologic    Bleeding problems:    Problems with blood clotting too easily:        Skin    Rashes or ulcers:        Constitutional    Fever or chills:     PHYSICAL EXAM:   Vitals:   05/11/22 1424  BP: 119/71  Pulse: 90  Resp: 18  Temp: 98.6 F (37 C)  TempSrc: Temporal  SpO2: 100%  Weight: 152 lb 6.4 oz (69.1 kg)  Height: '5\' 5"'$  (1.651 m)    GENERAL: The patient is a well-nourished female, in no acute distress. The vital signs are documented above. CARDIAC: There is a regular rate and rhythm.  VASCULAR: I do not detect carotid bruits. She has palpable pedal pulses. She has dilated varicose veins in her medial left thigh and left calf is documented in the photograph below.  She also has hyperpigmentation in the lower left leg.   I did look at the left great saphenous vein myself with the SonoSite and she has reflux throughout down to the knee.  The vein is significantly dilated.  I would like to cannulate the vein around the knee.  PULMONARY: There is good  air exchange bilaterally without wheezing or rales. ABDOMEN: Soft and non-tender with normal pitched bowel sounds.  MUSCULOSKELETAL: There are no major deformities or cyanosis. NEUROLOGIC: No focal weakness or paresthesias  are detected. SKIN: There are no ulcers or rashes noted. PSYCHIATRIC: The patient has a normal affect.  DATA:    VENOUS DUPLEX: I have independently interpreted her venous duplex scan.  This was of the left lower extremity only.  This was done on 05/05/2022.  There was no evidence of DVT.  There was deep venous reflux in the common femoral vein.  There were superficial venous reflux in the left great saphenous vein down to the proximal calf.  Diameters of the vein range from 7 to 10 mm.     Deitra Mayo [facility] [phone]

## 2022-05-24 ENCOUNTER — Other Ambulatory Visit: Payer: Self-pay | Admitting: *Deleted

## 2022-05-24 DIAGNOSIS — I83812 Varicose veins of left lower extremities with pain: Secondary | ICD-10-CM

## 2022-05-25 ENCOUNTER — Other Ambulatory Visit: Payer: Self-pay | Admitting: *Deleted

## 2022-05-25 MED ORDER — LORAZEPAM 1 MG PO TABS: ORAL_TABLET | ORAL | 0 refills | Status: DC

## 2022-06-01 ENCOUNTER — Encounter: Payer: Self-pay | Admitting: Vascular Surgery

## 2022-06-01 ENCOUNTER — Ambulatory Visit: Payer: BC Managed Care – PPO | Admitting: Vascular Surgery

## 2022-06-01 VITALS — BP 124/77 | HR 81 | Temp 98.1°F | Resp 16 | Ht 65.0 in | Wt 150.0 lb

## 2022-06-01 DIAGNOSIS — I83812 Varicose veins of left lower extremities with pain: Secondary | ICD-10-CM | POA: Diagnosis not present

## 2022-06-01 HISTORY — PX: ENDOVENOUS ABLATION SAPHENOUS VEIN W/ LASER: SUR449

## 2022-06-01 NOTE — Progress Notes (Signed)
     Laser Ablation Procedure     Date: 06/01/2022   Rachael Scott DOB:[birth year]  Consent signed: Yes      Surgeon: Gae Gallop MD   Procedure: Laser Ablation: left Greater Saphenous Vein  BP 124/77 (BP Location: Left Arm, Patient Position: Sitting, Cuff Size: Normal)   Pulse 81   Temp 98.1 F (36.7 C) (Temporal)   Resp 16   Ht '5\' 5"'$  (1.651 m)   Wt 150 lb (68 kg)   LMP 02/19/2020   SpO2 100%   BMI 24.96 kg/m   Tumescent Anesthesia: 425 cc 0.9% NaCl with 50 cc Lidocaine HCL 1%  and 15 cc 8.4% NaHCO3  Local Anesthesia: 10 cc Lidocaine HCL and NaHCO3 (ratio 2:1)  7 watts continuous mode     Total energy: 1230 Joules    Total time: 175 seconds Treatment Length  26 cm   Laser Fiber Ref. # [id number]     Lot #  [id number]   Stab Phlebectomy: 10-20 Sites: Thigh and Calf  Patient tolerated procedure well  Notes:   All staff members wore facial masks. [name] took Ativan 1 mg (1 tablet) on 06-01-2022 at 9:45 AM.    Description of Procedure:  After marking the course of the secondary varicosities, the patient was placed on the operating table in the supine position, and the left leg was prepped and draped in sterile fashion.   Local anesthetic was administered and under ultrasound guidance the saphenous vein was accessed with a micro needle and guide wire; then the mirco puncture sheath was placed.  A guide wire was inserted saphenofemoral junction , followed by a 5 french sheath.  The position of the sheath and then the laser fiber below the junction was confirmed using the ultrasound.  Tumescent anesthesia was administered along the course of the saphenous vein using ultrasound guidance. The patient was placed in Trendelenburg position and protective laser glasses were placed on patient and staff, and the laser was fired at 7 watts continuous mode for a total of 1230 joules.   For stab phlebectomies, local anesthetic was administered at the previously marked varicosities,  and tumescent anesthesia was administered around the vessels.  Ten to 20 stab wounds were made using the tip of an 11 blade. And using the vein hook, the phlebectomies were performed using a hemostat to avulse the varicosities.  Adequate hemostasis was achieved.     Steri strips were applied to the stab wounds and ABD pads and thigh high compression stockings were applied.  Ace wrap bandages were applied over the phlebectomy sites and at the top of the saphenofemoral junction. Blood loss was less than 15 cc.  Discharge instructions reviewed with patient and hardcopy of discharge instructions given to patient to take home. The patient ambulated out of the operating room having tolerated the procedure well.

## 2022-06-01 NOTE — Progress Notes (Signed)
   Patient name: Rachael Scott MRN: [id number] DOB: [birth year] Sex: female  REASON FOR VISIT: For laser ablation of the left great saphenous vein with 10-20 stabs.  HPI: Rachael Scott is a 56 y.o. female who I saw on 05/11/2022 with chronic venous insufficiency.  She had CEAP C4 venous disease.  She had failed conservative treatment and I felt that she was a candidate for laser ablation of the left great saphenous vein and 10-20 stabs.  I looked at the left great saphenous vein myself with the SonoSite and I felt that we could cannulate this around the knee.  Current Outpatient Medications  Medication Sig Dispense Refill   acetaminophen (TYLENOL) 325 MG tablet Take 650 mg by mouth every 6 (six) hours as needed for pain.     amLODipine (NORVASC) 5 MG tablet Take 1 tablet (5 mg total) by mouth daily. 90 tablet 3   imipramine (TOFRANIL) 50 MG tablet Take 50 mg by mouth daily.     LORazepam (ATIVAN) 1 MG tablet Take 1 tablet 30 minutes prior to leaving house on day of office surgery. Bring second tablet with you to office on day of office surgery. 2 tablet 0   promethazine (PHENERGAN) 25 MG tablet Take 1 tablet (25 mg total) by mouth every 6 (six) hours as needed for nausea or vomiting. 30 tablet 0   No current facility-administered medications for this visit.    PHYSICAL EXAM: Vitals:   06/01/22 1029  BP: 124/77  Pulse: 81  Resp: 16  Temp: 98.1 F (36.7 C)  TempSrc: Temporal  SpO2: 100%  Weight: 150 lb (68 kg)  Height: '5\' 5"'$  (1.651 m)    PROCEDURE: Laser ablation left great saphenous vein with 10-20 stabs  TECHNIQUE: Patient was taken to the exam room and the dilated varicose veins were marked with the patient standing.  Patient was then placed supine.  I looked at the left great saphenous vein with the SonoSite and I felt like to cannulate this in the distal thigh.  The left leg was prepped and draped in usual sterile fashion.  Under ultrasound guidance, after the skin was  anesthetized, I cannulated the left great saphenous vein in the distal thigh with a micropuncture needle and a micropuncture sheath was introduced over the wire.  I then advanced the J-wire to just below the saphenofemoral junction.  The dilator and sheath were then advanced over the wire and the wire and dilator removed.  The laser fiber was positioned into the sheath and the sheath retracted.  I position the laser fiber 2.5 cm distal to the saphenofemoral junction.  Tumescent anesthesia was then administered circumferentially around the vein.  Patient was then placed in Trendelenburg.  Laser ablation was performed of the left great saphenous vein from 2-1/2 cm distal to the saphenofemoral junction to the distal thigh.  50 J/cm was used at 59 W.  Attention was then turned to stab phlebectomies.  All the marked areas were marked with tumescent anesthesia.  Approximately 15 small stab incisions were made and then the vein "brought above the skin and gently excised.  Pressure was held for hemostasis.  At the completion Steri-Strips were applied and a pressure dressing was applied.  Patient tolerated procedure well.  She will return in 1 week for a follow-up visit.  Deitra Mayo [facility] [phone]

## 2022-06-08 ENCOUNTER — Ambulatory Visit (HOSPITAL_COMMUNITY)
Admission: RE | Admit: 2022-06-08 | Discharge: 2022-06-08 | Disposition: A | Discharge status: Home / Self Care | Payer: BC Managed Care – PPO | Source: Ambulatory Visit | Attending: Vascular Surgery | Admitting: Vascular Surgery

## 2022-06-08 ENCOUNTER — Encounter: Payer: Self-pay | Admitting: Vascular Surgery

## 2022-06-08 ENCOUNTER — Ambulatory Visit (INDEPENDENT_AMBULATORY_CARE_PROVIDER_SITE_OTHER): Payer: BC Managed Care – PPO | Admitting: Vascular Surgery

## 2022-06-08 VITALS — BP 147/87 | HR 87 | Temp 98.1°F | Resp 16 | Ht 65.0 in | Wt 150.0 lb

## 2022-06-08 DIAGNOSIS — I83812 Varicose veins of left lower extremities with pain: Secondary | ICD-10-CM | POA: Diagnosis not present

## 2022-06-08 NOTE — Progress Notes (Signed)
   Patient name: Rachael Scott MRN: [id number] DOB: [birth year] Sex: female  REASON FOR VISIT: Follow-up after laser ablation of the left great saphenous vein with 10-20 stabs.  HPI: Rachael Scott is a 56 y.o. female who had presented with C4 venous disease.  She had failed conservative treatment and was felt to be a good candidate for laser ablation of the left great saphenous vein and 10-20 stabs.  She underwent the procedure on 06/01/2022.  The left great saphenous vein was treated down to the distal thigh.  She comes in for a 1 week follow-up visit.  Since I saw her last she has no specific complaints.  She had minimal bruising.  She has been wearing her thigh-high compression stocking.  She denies chest pain or shortness of breath.  Current Outpatient Medications  Medication Sig Dispense Refill   acetaminophen (TYLENOL) 325 MG tablet Take 650 mg by mouth every 6 (six) hours as needed for pain.     amLODipine (NORVASC) 5 MG tablet Take 1 tablet (5 mg total) by mouth daily. 90 tablet 3   imipramine (TOFRANIL) 50 MG tablet Take 50 mg by mouth daily.     promethazine (PHENERGAN) 25 MG tablet Take 1 tablet (25 mg total) by mouth every 6 (six) hours as needed for nausea or vomiting. 30 tablet 0   LORazepam (ATIVAN) 1 MG tablet Take 1 tablet 30 minutes prior to leaving house on day of office surgery. Bring second tablet with you to office on day of office surgery. (Patient not taking: Reported on 06/08/2022) 2 tablet 0   No current facility-administered medications for this visit.   REVIEW OF SYSTEMS: Valu.Nieves ] denotes positive finding; [  ] denotes negative finding  CARDIOVASCULAR:  '[ ]'$  chest pain   '[ ]'$  dyspnea on exertion  '[ ]'$  leg swelling  CONSTITUTIONAL:  '[ ]'$  fever   '[ ]'$  chills  PHYSICAL EXAM: Vitals:   06/08/22 1030  BP: (!) 147/87  Pulse: 87  Resp: 16  Temp: 98.1 F (36.7 C)  TempSrc: Temporal  SpO2: 99%  Weight: 150 lb (68 kg)  Height: '5\' 5"'$  (1.651 m)   GENERAL: The patient is a  well-nourished female, in no acute distress. The vital signs are documented above. CARDIOVASCULAR: There is a regular rate and rhythm. PULMONARY: There is good air exchange bilaterally without wheezing or rales. Her incisions are all healing nicely.  She has no significant leg swelling.  DATA:  VENOUS DUPLEX: I have independently interpreted her venous duplex scan today.  This shows no evidence of DVT in the left lower extremity.  The left great saphenous vein was successfully closed from the distal thigh to within 9 mm of the saphenofemoral junction.  MEDICAL ISSUES:  S/P LASER ABLATION OF LEFT GREAT SAPHENOUS VEIN/10-20 STABS: The patient is doing well status post laser ablation of the left great saphenous vein and 10-20 stabs.  She has 1 more week with her thigh-high compression stocking.  She has no symptoms on the right side.  We will see her as needed.  Deitra Mayo [facility] [facility] [phone]

## 2023-04-09 ENCOUNTER — Other Ambulatory Visit: Payer: Self-pay | Admitting: Nurse Practitioner

## 2023-04-09 DIAGNOSIS — I10 Essential (primary) hypertension: Secondary | ICD-10-CM

## 2024-01-02 ENCOUNTER — Telehealth: Admitting: Family Medicine

## 2024-01-02 ENCOUNTER — Telehealth: Payer: Self-pay

## 2024-01-02 DIAGNOSIS — Z91199 Patient's noncompliance with other medical treatment and regimen due to unspecified reason: Secondary | ICD-10-CM

## 2024-01-02 NOTE — Progress Notes (Signed)
 The patient no-showed for appointment despite this provider sending direct link, reaching out via phone with no response and waiting for at least 10 minutes from appointment time for patient to join. They will be marked as a NS for this appointment/time.   Freddy Finner, NP

## 2024-01-02 NOTE — Telephone Encounter (Signed)
 Att to return patient call about missed appt, no ans lvm -would suggest pt attempt to reschedule due to missed appointment link sent by provider at time of appt.

## 2024-01-03 ENCOUNTER — Telehealth

## 2024-01-03 DIAGNOSIS — J0110 Acute frontal sinusitis, unspecified: Secondary | ICD-10-CM

## 2024-01-03 MED ORDER — BENZONATATE 100 MG PO CAPS: 100.0000 mg | ORAL_CAPSULE | Freq: Three times a day (TID) | ORAL | 0 refills | Status: DC | PRN

## 2024-01-03 MED ORDER — FLUTICASONE PROPIONATE 50 MCG/ACT NA SUSP: 2.0000 | Freq: Every day | NASAL | 0 refills | Status: DC

## 2024-01-03 NOTE — Patient Instructions (Signed)
 Rachael Scott, thank you for joining Rachael Maillard, PA-C for today's virtual visit.  While this provider is not your primary care provider (PCP), if your PCP is located in our provider database this encounter information will be shared with them immediately following your visit.   A [facility] MyChart account gives you access to today's visit and all your visits, tests, and labs performed at [facility] " click here if you don't have a [facility] MyChart account or go to [email or web address]  Consent: (Patient) Rachael Scott provided verbal consent for this virtual visit at the beginning of the encounter.  Current Medications:  Current Outpatient Medications:    benzonatate (TESSALON) 100 MG capsule, Take 1 capsule (100 mg total) by mouth 3 (three) times daily as needed for cough., Disp: 30 capsule, Rfl: 0   fluticasone (FLONASE) 50 MCG/ACT nasal spray, Place 2 sprays into both nostrils daily., Disp: 16 g, Rfl: 0   acetaminophen (TYLENOL) 325 MG tablet, Take 650 mg by mouth every 6 (six) hours as needed for pain., Disp: , Rfl:    amLODipine  (NORVASC ) 5 MG tablet, TAKE 1 TABLET(5 MG) BY MOUTH DAILY, Disp: 90 tablet, Rfl: 3   imipramine (TOFRANIL) 50 MG tablet, Take 50 mg by mouth daily., Disp: , Rfl:    LORazepam  (ATIVAN ) 1 MG tablet, Take 1 tablet 30 minutes prior to leaving house on day of office surgery. Bring second tablet with you to office on day of office surgery. (Patient not taking: Reported on 06/08/2022), Disp: 2 tablet, Rfl: 0   promethazine  (PHENERGAN ) 25 MG tablet, Take 1 tablet (25 mg total) by mouth every 6 (six) hours as needed for nausea or vomiting., Disp: 30 tablet, Rfl: 0   Medications ordered in this encounter:  Meds ordered this encounter  Medications   fluticasone (FLONASE) 50 MCG/ACT nasal spray    Sig: Place 2 sprays into both nostrils daily.    Dispense:  16 g    Refill:  0    Supervising Provider:   Corine Dice [[id number]]    benzonatate (TESSALON) 100 MG capsule    Sig: Take 1 capsule (100 mg total) by mouth 3 (three) times daily as needed for cough.    Dispense:  30 capsule    Refill:  0    Supervising Provider:   Corine Dice [[id number]]     *If you need refills on other medications prior to your next appointment, please contact your pharmacy*  Follow-Up: Call back or seek an in-person evaluation if the symptoms worsen or if the condition fails to improve as anticipated.  [facility] [phone]  Other Instructions  Increase fluid intake.  Use Saline nasal spray.  Take a daily multivitamin. Ok to continue your OTC medications. Start the Flonase and Tessalon as directed.  Place a humidifier in the bedroom. If symptoms continue to progress, please start the Augmentin, taking as directed.  Please call or return clinic if symptoms are not improving.  Sinusitis Sinusitis is redness, soreness, and swelling (inflammation) of the paranasal sinuses. Paranasal sinuses are air pockets within the bones of your face (beneath the eyes, the middle of the forehead, or above the eyes). In healthy paranasal sinuses, mucus is able to drain out, and air is able to circulate through them by way of your nose. However, when your paranasal sinuses are inflamed, mucus and air can become trapped. This can allow bacteria and other germs to grow and cause infection. Sinusitis can  develop quickly and last only a short time (acute) or continue over a long period (chronic). Sinusitis that lasts for more than 12 [place] is considered chronic.  CAUSES  Causes of sinusitis include: Allergies. Structural abnormalities, such as displacement of the cartilage that separates your nostrils (deviated septum), which can decrease the air flow through your nose and sinuses and affect sinus drainage. Functional abnormalities, such as when the small hairs (cilia) that line your sinuses and help remove mucus do not work properly or are not  present. SYMPTOMS  Symptoms of acute and chronic sinusitis are the same. The primary symptoms are pain and pressure around the affected sinuses. Other symptoms include: Upper toothache. Earache. Headache. Bad breath. Decreased sense of smell and taste. A cough, which worsens when you are lying flat. Fatigue. Fever. Thick drainage from your nose, which often is green and may contain pus (purulent). Swelling and warmth over the affected sinuses. DIAGNOSIS  Your caregiver will perform a physical exam. During the exam, your caregiver may: Look in your nose for signs of abnormal growths in your nostrils (nasal polyps). Tap over the affected sinus to check for signs of infection. View the inside of your sinuses (endoscopy) with a special imaging device with a light attached (endoscope), which is inserted into your sinuses. If your caregiver suspects that you have chronic sinusitis, one or more of the following tests may be recommended: Allergy tests. Nasal culture A sample of mucus is taken from your nose and sent to a lab and screened for bacteria. Nasal cytology A sample of mucus is taken from your nose and examined by your caregiver to determine if your sinusitis is related to an allergy. TREATMENT  Most cases of acute sinusitis are related to a viral infection and will resolve on their own within 10 days. Sometimes medicines are prescribed to help relieve symptoms (pain medicine, decongestants, nasal steroid sprays, or saline sprays).  However, for sinusitis related to a bacterial infection, your caregiver will prescribe antibiotic medicines. These are medicines that will help kill the bacteria causing the infection.  Rarely, sinusitis is caused by a fungal infection. In theses cases, your caregiver will prescribe antifungal medicine. For some cases of chronic sinusitis, surgery is needed. Generally, these are cases in which sinusitis recurs more than 3 times per year, despite other  treatments. HOME CARE INSTRUCTIONS  Drink plenty of water. Water helps thin the mucus so your sinuses can drain more easily. Use a humidifier. Inhale steam 3 to 4 times a day (for example, sit in the bathroom with the shower running). Apply a warm, moist washcloth to your face 3 to 4 times a day, or as directed by your caregiver. Use saline nasal sprays to help moisten and clean your sinuses. Take over-the-counter or prescription medicines for pain, discomfort, or fever only as directed by your caregiver. SEEK IMMEDIATE MEDICAL CARE IF: You have increasing pain or severe headaches. You have nausea, vomiting, or drowsiness. You have swelling around your face. You have vision problems. You have a stiff neck. You have difficulty breathing. MAKE SURE YOU:  Understand these instructions. Will watch your condition. Will get help right away if you are not doing well or get worse. Document Released: 07/25/2005 Document Revised: 10/17/2011 Document Reviewed: 08/09/2011 [facility] Patient Information 2014 [place], [place].    If you have been instructed to have an in-person evaluation today at a local Urgent Care facility, please use the link below. It will take you to a list of all of  our available [facility], including address, phone number and hours of operation. Please do not delay care.  [facility]  If you or a family member do not have a primary care provider, use the link below to schedule a visit and establish care. When you choose a [facility] primary care physician or advanced practice provider, you gain a long-term partner in health. Find a Primary Care Provider  Learn more about [facility] in-office and virtual care options: [facility] - Get Care Now

## 2024-01-03 NOTE — Progress Notes (Signed)
 Virtual Visit Consent   Rachael Scott, you are scheduled for a virtual visit with a [facility] provider today. Just as with appointments in the office, your consent must be obtained to participate. Your consent will be active for this visit and any virtual visit you may have with one of our providers in the next 365 days. If you have a MyChart account, a copy of this consent can be sent to you electronically.  As this is a virtual visit, video technology does not allow for your provider to perform a traditional examination. This may limit your provider's ability to fully assess your condition. If your provider identifies any concerns that need to be evaluated in person or the need to arrange testing (such as labs, EKG, etc.), we will make arrangements to do so. Although advances in technology are sophisticated, we cannot ensure that it will always work on either your end or our end. If the connection with a video visit is poor, the visit may have to be switched to a telephone visit. With either a video or telephone visit, we are not always able to ensure that we have a secure connection.  By engaging in this virtual visit, you consent to the provision of healthcare and authorize for your insurance to be billed (if applicable) for the services provided during this visit. Depending on your insurance coverage, you may receive a charge related to this service.  I need to obtain your verbal consent now. Are you willing to proceed with your visit today? Bess A Ton has provided verbal consent on 01/03/2024 for a virtual visit (video or telephone). Rachael Scott, [place]  Date: 01/03/2024 7:40 AM   Virtual Visit via Video Note   I, Rachael Scott, connected with  Rachael Scott  ([id number], Aug 31, 1965) on 01/03/24 at  7:45 AM EDT by a video-enabled telemedicine application and verified that I am speaking with the correct person using two identifiers.  Location: Patient: Virtual Visit Location  Patient: Home Provider: Virtual Visit Location Provider: Home Office   I discussed the limitations of evaluation and management by telemedicine and the availability of in person appointments. The patient expressed understanding and agreed to proceed.    History of Present Illness: Rachael Scott is a 58 y.o. who identifies as a female who was assigned female at birth, and is being seen today for possible sinusitis. Endorses headache, cough, nasal/sinus congestion with frontal sinus pressure, PND and sore throat. Coworker with similar symptoms. Now with upper tooth pain in the past day or so. Afebrile. Denies substantial chest congestion.   OTC -- Tylenol, Alka Seltzer Severe Cold.    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   Plantar fascial fibromatosis 05/21/2013   Attention deficit disorder 09/12/2011    Allergies: No Known Allergies Medications:  Current Outpatient Medications:    benzonatate (TESSALON) 100 MG capsule, Take 1 capsule (100 mg total) by mouth 3 (three) times daily as needed for cough., Disp: 30 capsule, Rfl: 0   fluticasone (FLONASE) 50 MCG/ACT nasal spray, Place 2 sprays into both nostrils daily., Disp: 16 g, Rfl: 0   acetaminophen (TYLENOL) 325 MG tablet, Take 650 mg by mouth every 6 (six)  hours as needed for pain., Disp: , Rfl:    amLODipine  (NORVASC ) 5 MG tablet, TAKE 1 TABLET(5 MG) BY MOUTH DAILY, Disp: 90 tablet, Rfl: 3   imipramine (TOFRANIL) 50 MG tablet, Take 50 mg by mouth daily., Disp: , Rfl:    LORazepam  (ATIVAN ) 1 MG tablet, Take 1 tablet 30 minutes prior to leaving house on day of office surgery. Bring second tablet with you to office on day of office surgery. (Patient not  taking: Reported on 06/08/2022), Disp: 2 tablet, Rfl: 0   promethazine  (PHENERGAN ) 25 MG tablet, Take 1 tablet (25 mg total) by mouth every 6 (six) hours as needed for nausea or vomiting., Disp: 30 tablet, Rfl: 0  Observations/Objective: Patient is well-developed, well-nourished in no acute distress.  Resting comfortably at home.  Head is normocephalic, atraumatic.  No labored breathing. Speech is clear and coherent with logical content.  Patient is alert and oriented at baseline.   Assessment and Plan: 1. Acute non-recurrent frontal sinusitis (Primary) - fluticasone  (FLONASE ) 50 MCG/ACT nasal spray; Place 2 sprays into both nostrils daily.  Dispense: 16 g; Refill: 0 - benzonatate  (TESSALON ) 100 MG capsule; Take 1 capsule (100 mg total) by mouth 3 (three) times daily as needed for cough.  Dispense: 30 capsule; Refill: 0  Supportive measures and OTC medications reviewed. Start Flonase  and Tessalon  per orders. Discussed viral versus bacterial etiology -- most likely viral giving known exposure. Giving tooth pain, will put script for Augmentin on file at pharmacy to start as directed for any continued progression of symptoms.   Follow Up Instructions: I discussed the assessment and treatment plan with the patient. The patient was provided an opportunity to ask questions and all were answered. The patient agreed with the plan and demonstrated an understanding of the instructions.  A copy of instructions were sent to the patient via MyChart unless otherwise noted below.   The patient was advised to call back or seek an in-person evaluation if the symptoms worsen or if the condition fails to improve as anticipated.    Rachael Maillard, PA-C

## 2024-01-16 ENCOUNTER — Other Ambulatory Visit: Payer: Self-pay | Admitting: Nurse Practitioner

## 2024-01-16 DIAGNOSIS — I10 Essential (primary) hypertension: Secondary | ICD-10-CM

## 2024-01-18 NOTE — Telephone Encounter (Signed)
 Patient last seen in August 2023 were to follow up in 1 year    Medication: Amlodipine  (Norvasc ) 5 mg  Directions: Take 1 tablet by mouth daily  Last given: 04/11/23 Number refills: 3 Last o/v: 03/17/22 Follow up: 1 year (around 03/18/23)-NOTHING scheduled Labs: 03/17/22

## 2024-01-18 NOTE — Telephone Encounter (Signed)
 Called patient and left a voice message per DPR on file asking to give our office a call at [phone] to set up an office visit with Kathrene Parents, NP or to let us  know if Kathrene Parents, NP is no longer her primary care provider. I also sent a MyChart message to patient.

## 2024-02-02 NOTE — Telephone Encounter (Signed)
 A text message was sent to patient informing her the our office will not be able to refill her medication because she is due for an appointment and to give our office a call.

## 2024-04-16 ENCOUNTER — Other Ambulatory Visit: Payer: Self-pay | Admitting: Nurse Practitioner

## 2024-04-16 DIAGNOSIS — Z1231 Encounter for screening mammogram for malignant neoplasm of breast: Secondary | ICD-10-CM

## 2024-04-17 ENCOUNTER — Ambulatory Visit
Admission: RE | Admit: 2024-04-17 | Discharge: 2024-04-17 | Disposition: A | Discharge status: Home / Self Care | Source: Ambulatory Visit | Attending: Nurse Practitioner | Admitting: Nurse Practitioner

## 2024-04-17 DIAGNOSIS — Z1231 Encounter for screening mammogram for malignant neoplasm of breast: Secondary | ICD-10-CM

## 2024-05-01 ENCOUNTER — Ambulatory Visit (INDEPENDENT_AMBULATORY_CARE_PROVIDER_SITE_OTHER): Admitting: Nurse Practitioner

## 2024-05-01 ENCOUNTER — Ambulatory Visit: Payer: Self-pay | Admitting: Nurse Practitioner

## 2024-05-01 ENCOUNTER — Encounter: Payer: Self-pay | Admitting: Nurse Practitioner

## 2024-05-01 VITALS — BP 138/88 | HR 84 | Temp 98.3°F | Ht 65.0 in | Wt 137.8 lb

## 2024-05-01 DIAGNOSIS — I10 Essential (primary) hypertension: Secondary | ICD-10-CM

## 2024-05-01 DIAGNOSIS — Z0000 Encounter for general adult medical examination without abnormal findings: Secondary | ICD-10-CM

## 2024-05-01 DIAGNOSIS — D72819 Decreased white blood cell count, unspecified: Secondary | ICD-10-CM | POA: Diagnosis not present

## 2024-05-01 DIAGNOSIS — E782 Mixed hyperlipidemia: Secondary | ICD-10-CM

## 2024-05-01 DIAGNOSIS — E559 Vitamin D deficiency, unspecified: Secondary | ICD-10-CM | POA: Diagnosis not present

## 2024-05-01 DIAGNOSIS — Z0001 Encounter for general adult medical examination with abnormal findings: Secondary | ICD-10-CM

## 2024-05-01 LAB — TSH: TSH: 0.85 u[IU]/mL (ref 0.35–5.50)

## 2024-05-01 LAB — VITAMIN D 25 HYDROXY (VIT D DEFICIENCY, FRACTURES): VITD: 26.64 ng/mL — ABNORMAL LOW (ref 30.00–100.00)

## 2024-05-01 LAB — LDL CHOLESTEROL, DIRECT: Direct LDL: 110 mg/dL

## 2024-05-01 LAB — COMPREHENSIVE METABOLIC PANEL WITH GFR
ALT: 11 U/L (ref 0–35)
AST: 19 U/L (ref 0–37)
Albumin: 4.4 g/dL (ref 3.5–5.2)
Alkaline Phosphatase: 62 U/L (ref 39–117)
BUN: 13 mg/dL (ref 6–23)
CO2: 33 meq/L — ABNORMAL HIGH (ref 19–32)
Calcium: 9.6 mg/dL (ref 8.4–10.5)
Chloride: 100 meq/L (ref 96–112)
Creatinine, Ser: 0.99 mg/dL (ref 0.40–1.20)
GFR: 63.02 mL/min (ref >60.00)
Glucose, Bld: 85 mg/dL (ref 70–99)
Potassium: 3.9 meq/L (ref 3.5–5.1)
Sodium: 140 meq/L (ref 135–145)
Total Bilirubin: 0.6 mg/dL (ref 0.2–1.2)
Total Protein: 7.1 g/dL (ref 6.0–8.3)

## 2024-05-01 LAB — CBC WITH DIFFERENTIAL/PLATELET
Basophils Absolute: 0 K/uL (ref 0.0–0.1)
Basophils Relative: 0.8 % (ref 0.0–3.0)
Eosinophils Absolute: 0.1 K/uL (ref 0.0–0.7)
Eosinophils Relative: 1.6 % (ref 0.0–5.0)
HCT: 40.3 % (ref 36.0–46.0)
Hemoglobin: 13.7 g/dL (ref 12.0–15.0)
Lymphocytes Relative: 23.8 % (ref 12.0–46.0)
Lymphs Abs: 1 K/uL (ref 0.7–4.0)
MCHC: 33.9 g/dL (ref 30.0–36.0)
MCV: 87.8 fl (ref 78.0–100.0)
Monocytes Absolute: 0.4 K/uL (ref 0.1–1.0)
Monocytes Relative: 9.5 % (ref 3.0–12.0)
Neutro Abs: 2.6 K/uL (ref 1.4–7.7)
Neutrophils Relative %: 64.3 % (ref 43.0–77.0)
Platelets: 218 K/uL (ref 150.0–400.0)
RBC: 4.59 Mil/uL (ref 3.87–5.11)
RDW: 13.1 % (ref 11.5–15.5)
WBC: 4 K/uL (ref 4.0–10.5)

## 2024-05-01 MED ORDER — AMLODIPINE BESYLATE 5 MG PO TABS: 5.0000 mg | ORAL_TABLET | Freq: Every day | ORAL | 1 refills | Status: AC

## 2024-05-01 NOTE — Assessment & Plan Note (Signed)
 Age 58 Associated with brain fog and hot flashes

## 2024-05-01 NOTE — Assessment & Plan Note (Addendum)
 BP at goal with amlodipine  BP Readings from Last 3 Encounters:  05/01/24 138/88  06/08/22 (!) 147/87  06/01/22 124/77   Repeat CMP Maintain med dose Refill sent F/up in 6months

## 2024-05-01 NOTE — Assessment & Plan Note (Signed)
 Not fasting today, so complete direct LDL today

## 2024-05-01 NOTE — Progress Notes (Signed)
 Complete physical exam  Patient: Rachael Scott   DOB: 10-Nov-1965   58 y.o. Female  MRN: [id number] Visit Date: 05/01/2024  Subjective:    Chief Complaint  Patient presents with   Annual Exam    NOT FASTING  Due for Shingles and Prevnar vaccines    Rachael Scott is a 58 y.o. female who presents today for a complete physical exam. She reports consuming a low fat and low sodium diet. Home exercise routine includes calisthenics and cardio and weight training. She generally feels well. She reports sleeping well. She does not have additional problems to discuss today.  Vision:No Dental:Yes STD Screen:No  BP Readings from Last 3 Encounters:  05/01/24 138/88  06/08/22 (!) 147/87  06/01/22 124/77   Wt Readings from Last 3 Encounters:  05/01/24 137 lb 12.8 oz (62.5 kg)  06/08/22 150 lb (68 kg)  06/01/22 150 lb (68 kg)   Most recent fall risk assessment:    05/01/2024   10:19 AM  Fall Risk   Falls in the past year? 0  Injury with Fall? 0  Risk for fall due to : No Fall Risks  Follow up Falls evaluation completed   Depression screen:Yes - No Depression Most recent depression screenings:    05/01/2024   10:19 AM 06/17/2021   11:48 AM  PHQ 2/9 Scores  PHQ - 2 Score 0 0  PHQ- 9 Score 0     HPI  HTN, goal below 130/80 BP at goal with amlodipine  BP Readings from Last 3 Encounters:  05/01/24 138/88  06/08/22 (!) 147/87  06/01/22 124/77   Repeat CMP Maintain med dose Refill sent F/up in 6months  Vitamin D  deficiency Repeat  Menopause Age 79 Associated with brain fog and hot flashes  Hyperlipidemia Not fasting today, so complete direct LDL today   Past Medical History:  Diagnosis Date   ADD (attention deficit disorder)    Gallbladder disease    Headache    3 a mo   History of chickenpox    Hypertension 2022   Past Surgical History:  Procedure Laterality Date   CESAREAN SECTION  1992   CHOLECYSTECTOMY     ENDOMETRIAL ABLATION     ENDOVENOUS ABLATION  SAPHENOUS VEIN W/ LASER Left 06/01/2022   endovenous laser ablation left greater saphenous vein and stab phlebectomy 10-20 incisions left leg by Medford Blade MD   tummy  2001   tummy tuck   Social History   Socioeconomic History   Marital status: Married    Spouse name: Not on file   Number of children: Not on file   Years of education: Not on file   Highest education level: Bachelor's degree (e.g., BA, AB, BS)  Occupational History   Not on file  Tobacco Use   Smoking status: Former    Current packs/day: 0.00    Average packs/day: 0.5 packs/day for 15.0 years (7.5 ttl pk-yrs)    Types: Cigarettes    Start date: 07/31/1988    Quit date: 08/01/2003    Years since quitting: 20.7    Passive exposure: Never   Smokeless tobacco: Never  Vaping Use   Vaping status: Never Used  Substance and Sexual Activity   Alcohol use: Yes    Alcohol/week: 1.0 standard drink of alcohol    Types: 1 Standard drinks or equivalent per week    Comment:  rare - once a month glass of wine   Drug use: No   Sexual activity: Yes  Birth control/protection: None  Other Topics Concern   Not on file  Social History Narrative   Not on file   Social Drivers of Health   Financial Resource Strain: Low Risk  (04/27/2024)   Overall Financial Resource Strain (CARDIA)    Difficulty of Paying Living Expenses: Not hard at all  Food Insecurity: No Food Insecurity (04/27/2024)   Hunger Vital Sign    Worried About Running Out of Food in the Last Year: Never true    Ran Out of Food in the Last Year: Never true  Transportation Needs: No Transportation Needs (04/27/2024)   PRAPARE - Administrator, Civil Service (Medical): No    Lack of Transportation (Non-Medical): No  Physical Activity: Sufficiently Active (04/27/2024)   Exercise Vital Sign    Days of Exercise per Week: 5 days    Minutes of Exercise per Session: 80 min  Stress: No Stress Concern Present (04/27/2024)   [employer] of  Occupational Health - Occupational Stress Questionnaire    Feeling of Stress: Only a little  Social Connections: Socially Integrated (04/27/2024)   Social Connection and Isolation Panel    Frequency of Communication with Friends and Family: More than three times a week    Frequency of Social Gatherings with Friends and Family: Twice a week    Attends Religious Services: More than 4 times per year    Active Member of [employer] or Organizations: Yes    Attends Engineer, structural: More than 4 times per year    Marital Status: Married  Catering manager Violence: Not on file   Family Status  Relation Name Status   Mother Lebron (Not Specified)   Father Marcey (Not Specified)   Sister  Alive   Neg Hx  (Not Specified)  No partnership data on file   Family History  Problem Relation Age of Onset   Hypertension Mother    Heart disease Mother    Vision loss Mother    Varicose Veins Mother    Hypertension Father    COPD Father    Heart disease Father    Hypothyroidism Sister    Breast cancer Neg Hx    No Known Allergies  Patient Care Team: Tilia Faso, Roselie Rockford, NP as PCP - General (Internal Medicine)   Medications: Outpatient Medications Prior to Visit  Medication Sig   imipramine (TOFRANIL) 50 MG tablet Take 50 mg by mouth daily.   promethazine  (PHENERGAN ) 25 MG tablet Take 1 tablet (25 mg total) by mouth every 6 (six) hours as needed for nausea or vomiting.   [DISCONTINUED] amLODipine  (NORVASC ) 5 MG tablet Take 1 tablet (5 mg total) by mouth daily. No additional refill without appointment with pcp.   [DISCONTINUED] acetaminophen (TYLENOL) 325 MG tablet Take 650 mg by mouth every 6 (six) hours as needed for pain.   [DISCONTINUED] benzonatate  (TESSALON ) 100 MG capsule Take 1 capsule (100 mg total) by mouth 3 (three) times daily as needed for cough.   [DISCONTINUED] fluticasone  (FLONASE ) 50 MCG/ACT nasal spray Place 2 sprays into both nostrils daily.   [DISCONTINUED] LORazepam   (ATIVAN ) 1 MG tablet Take 1 tablet 30 minutes prior to leaving house on day of office surgery. Bring second tablet with you to office on day of office surgery. (Patient not taking: Reported on 06/08/2022)   No facility-administered medications prior to visit.    Review of Systems  Constitutional:  Negative for activity change, appetite change and unexpected weight change.  Respiratory: Negative.  Cardiovascular: Negative.   Gastrointestinal: Negative.   Endocrine: Negative for cold intolerance and heat intolerance.  Genitourinary: Negative.   Musculoskeletal: Negative.   Skin: Negative.   Neurological: Negative.   Hematological: Negative.   Psychiatric/Behavioral:  Negative for behavioral problems, decreased concentration, dysphoric mood, hallucinations, self-injury, sleep disturbance and suicidal ideas. The patient is not nervous/anxious.         Objective:  BP 138/88 (BP Location: Left Arm, Patient Position: Sitting, Cuff Size: Normal)   Pulse 84   Temp 98.3 F (36.8 C) (Oral)   Ht 5' 5 (1.651 m)   Wt 137 lb 12.8 oz (62.5 kg)   LMP 02/19/2020   SpO2 99%   BMI 22.93 kg/m     Physical Exam Vitals and nursing note reviewed.  Constitutional:      General: She is not in acute distress. HENT:     Right Ear: Tympanic membrane, ear canal and external ear normal.     Left Ear: Tympanic membrane, ear canal and external ear normal.     Nose: Nose normal.  Eyes:     Extraocular Movements: Extraocular movements intact.     Conjunctiva/sclera: Conjunctivae normal.     Pupils: Pupils are equal, round, and reactive to light.  Neck:     Thyroid : No thyroid  mass, thyromegaly or thyroid  tenderness.  Cardiovascular:     Rate and Rhythm: Normal rate and regular rhythm.     Pulses: Normal pulses.     Heart sounds: Normal heart sounds.  Pulmonary:     Effort: Pulmonary effort is normal.     Breath sounds: Normal breath sounds.  Abdominal:     General: Bowel sounds are normal.      Palpations: Abdomen is soft.  Musculoskeletal:        General: Normal range of motion.     Cervical back: Normal range of motion and neck supple.     Right lower leg: No edema.     Left lower leg: No edema.  Lymphadenopathy:     Cervical: No cervical adenopathy.  Skin:    General: Skin is warm and dry.  Neurological:     Mental Status: She is alert and oriented to person, place, and time.     Cranial Nerves: No cranial nerve deficit.  Psychiatric:        Mood and Affect: Mood normal.        Behavior: Behavior normal.        Thought Content: Thought content normal.     No results found for any visits on 05/01/24.    Assessment & Plan:    Routine Health Maintenance and Physical Exam  Immunization History  Administered Date(s) Administered   Influenza,inj,Quad PF,6+ Mos 06/13/2018   PFIZER Comirnaty(Gray Top)Covid-19 Tri-Sucrose Vaccine 01/02/2021   PFIZER(Purple Top)SARS-COV-2 Vaccination 10/21/2019, 11/11/2019, 01/02/2021   Tdap 09/17/2021   Health Maintenance  Topic Date Due   Zoster Vaccines- Shingrix (1 of 2) 07/31/2024 (Originally 03/16/2016)   COVID-19 Vaccine (5 - 2025-26 season) 08/07/2024 (Originally 04/08/2024)   Influenza Vaccine  11/05/2024 (Originally 03/08/2024)   Pneumococcal Vaccine: 50+ Years (1 of 1 - PCV) 05/01/2025 (Originally 03/16/2016)   Hepatitis B Vaccines 19-59 Average Risk (1 of 3 - 19+ 3-dose series) 05/01/2025 (Originally 03/16/1985)   Hepatitis C Screening  05/01/2025 (Originally 03/16/1984)   HIV Screening  05/01/2025 (Originally 03/16/1981)   Fecal DNA (Cologuard)  03/31/2025   Mammogram  04/17/2026   Cervical Cancer Screening (HPV/Pap Cotest)  03/18/2027   DTaP/Tdap/Td (2 -  Td or Tdap) 09/18/2031   HPV VACCINES  Aged Out   Meningococcal B Vaccine  Aged Out   Discussed health benefits of physical activity, and encouraged her to engage in regular exercise appropriate for her age and condition.  Problem List Items Addressed This Visit     HTN, goal  below 130/80   BP at goal with amlodipine  BP Readings from Last 3 Encounters:  05/01/24 138/88  06/08/22 (!) 147/87  06/01/22 124/77   Repeat CMP Maintain med dose Refill sent F/up in 6months      Relevant Medications   amLODipine  (NORVASC ) 5 MG tablet   Hyperlipidemia   Not fasting today, so complete direct LDL today      Relevant Medications   amLODipine  (NORVASC ) 5 MG tablet   Other Relevant Orders   Direct LDL   TSH   Vitamin D  deficiency   Repeat      Relevant Orders   VITAMIN D  25 Hydroxy (Vit-D Deficiency, Fractures)   Other Visit Diagnoses       Encounter for preventative adult health care exam with abnormal findings    -  Primary   Relevant Orders   Comprehensive metabolic panel with GFR     Leukopenia, unspecified type       Relevant Orders   CBC with Differential/Platelet      Return in about 6 months (around 10/29/2024) for HTN, hyperlipidemia (fasting).     Roselie Mood, NP

## 2024-05-01 NOTE — Assessment & Plan Note (Signed)
 Repeat

## 2024-05-01 NOTE — Patient Instructions (Signed)
 Go to lab Maintain Heart healthy diet and daily exercise. Maintain current medications.
